# Patient Record
Sex: Female | Born: 1950 | Race: White | Hispanic: No | Marital: Married | Smoking: Never smoker
Health system: Southern US, Academic
[De-identification: ages and names within clinical notes are randomized; demographics above are authoritative.]

## PROBLEM LIST (undated history)

## (undated) DIAGNOSIS — E039 Hypothyroidism, unspecified: Secondary | ICD-10-CM

## (undated) DIAGNOSIS — B029 Zoster without complications: Secondary | ICD-10-CM

## (undated) HISTORY — PX: CYSTOPLASTY: SHX475

## (undated) HISTORY — DX: Hypothyroidism, unspecified: E03.9

## (undated) HISTORY — PX: HX DILATION AND CURETTAGE: SHX78

## (undated) HISTORY — PX: HX KNEE SURGERY: 2100001320

## (undated) HISTORY — PX: HX BREAST LUMPECTOMY: SHX2

## (undated) HISTORY — DX: Zoster without complications: B02.9

## (undated) HISTORY — PX: BLEPHAROPLASTY: SUR158

## (undated) HISTORY — PX: HX FOOT SURGERY: 2100001154

## (undated) HISTORY — PX: RECTAL SURGERY: SHX760

---

## 2015-03-03 IMAGING — MG MAMMOGRAPHY SCREENING BILATERAL 3D TOMOSYNTHESIS WITH CAD
12 of 16 series · 12 of 32 positions shown · non-contrast
Comparison: 01/03/2011
BREAST DENSITY: (Level B) There are scattered areas of fibroglandular density.

MAMMOGRAPHY SCREENING 3D TOMOSYNTHESIS WITH CAD, 03/03/2015 [DATE]:
CLINICAL INDICATION: Screening mammogram.
TECHNIQUE: Digital mammograms and 3-D Tomosynthesis were obtained. These were
interpreted both primarily and with the aid of computer-aided detection
system.

[R CC]
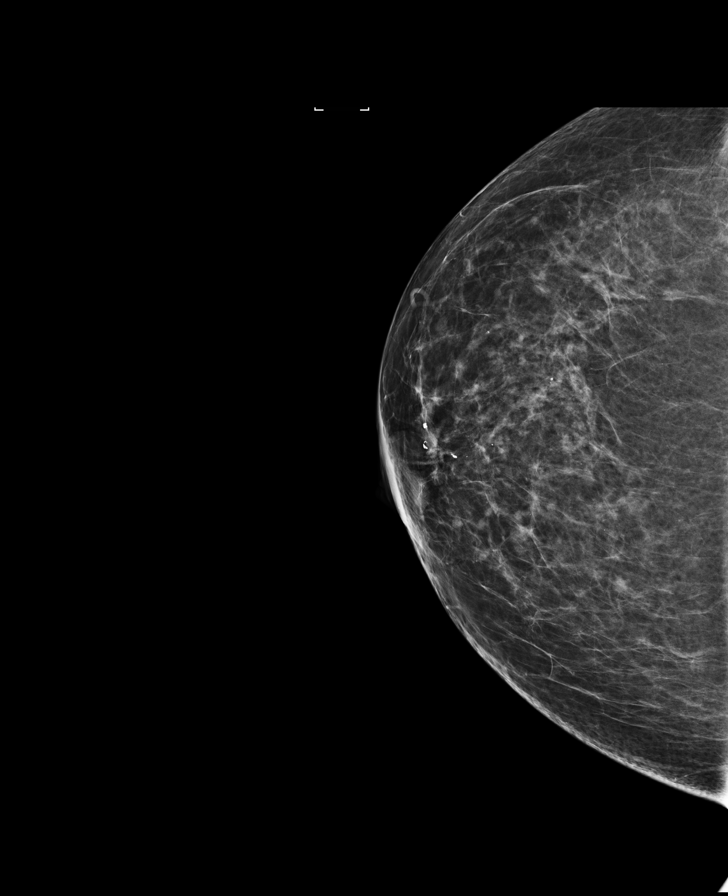

[R MLO synth-2D]
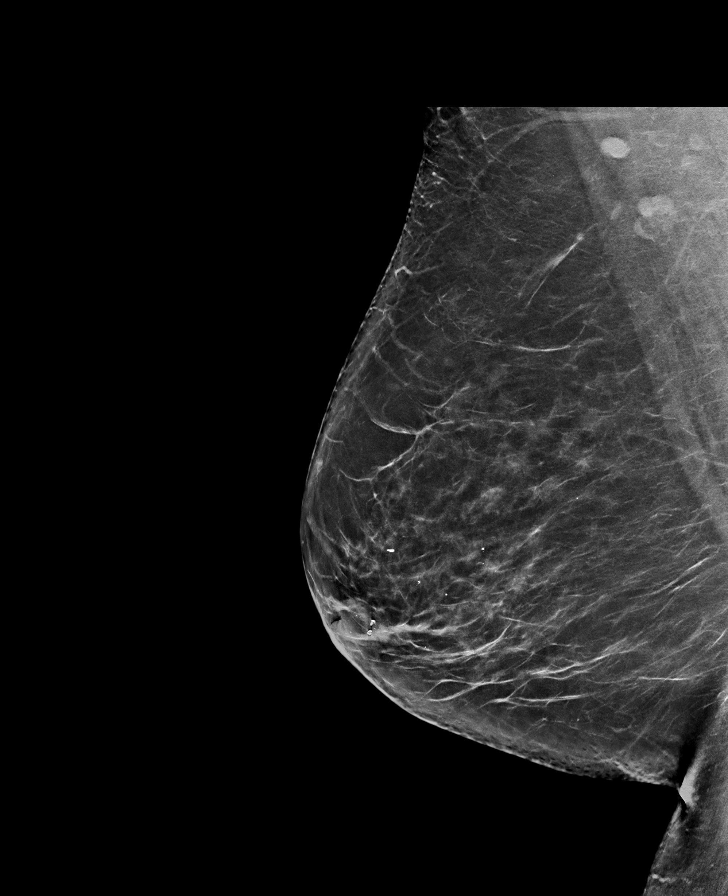

[L CC synth-2D]
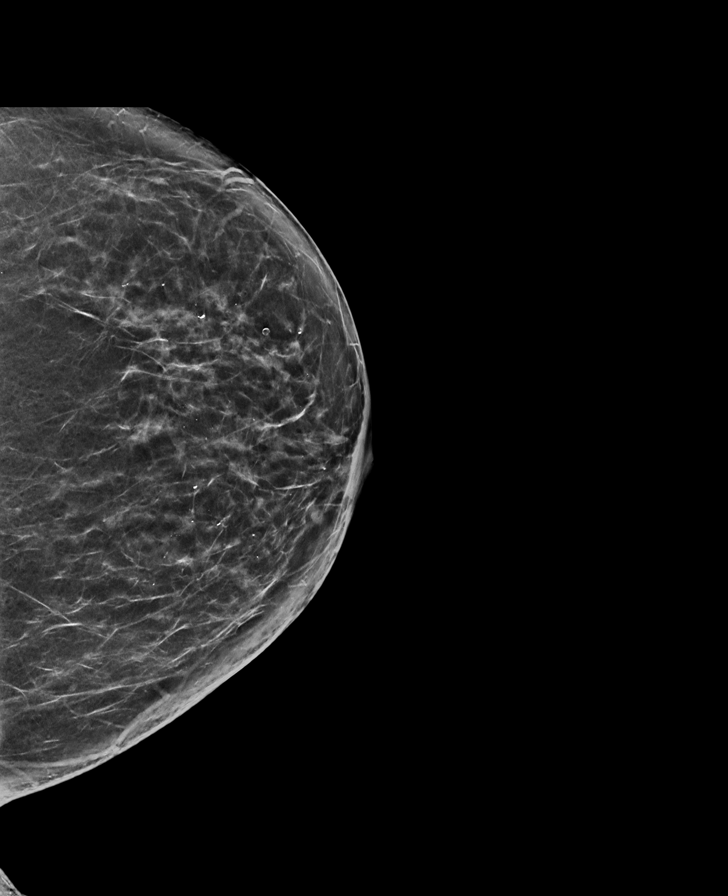

[L MLO]
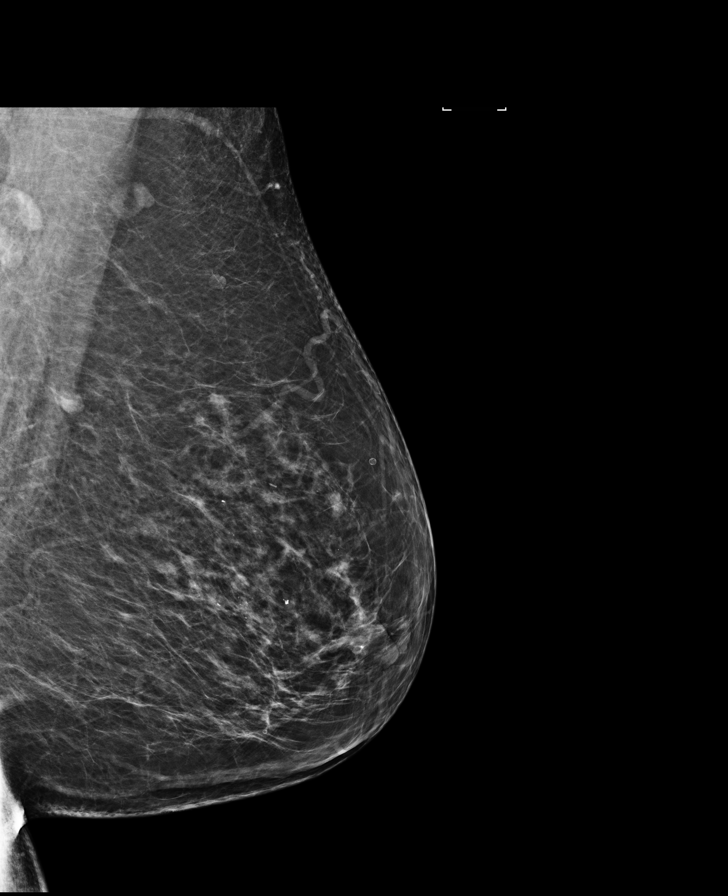

[L MLO synth-2D]
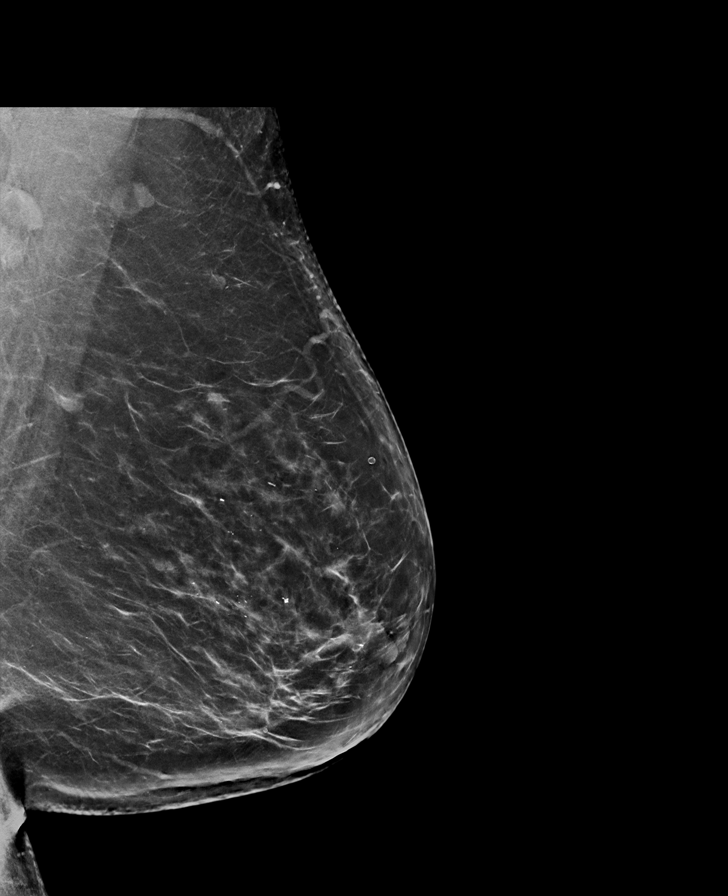

[R CC synth-2D]
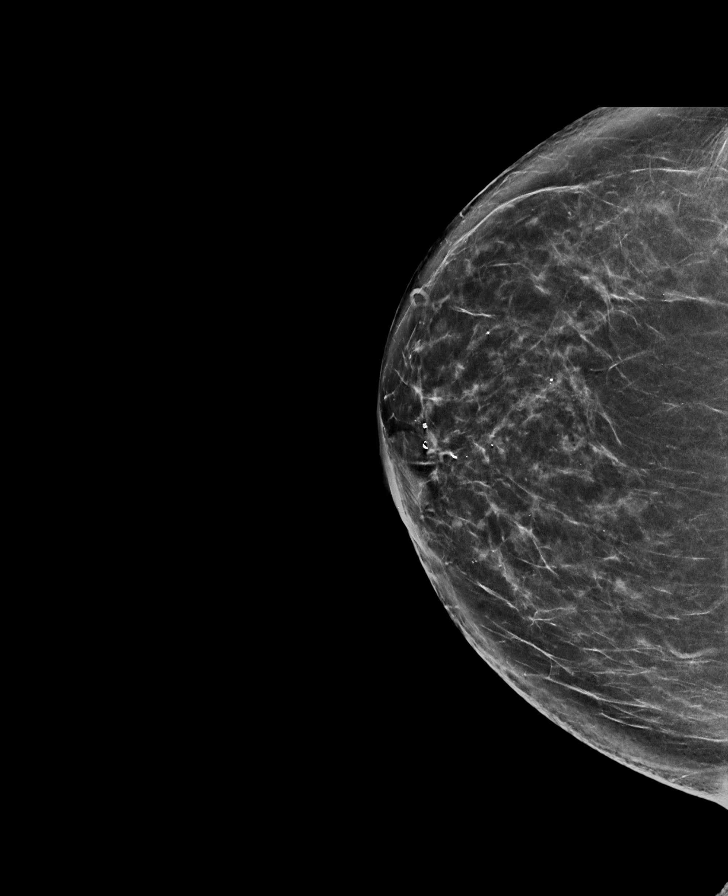

[L CC]
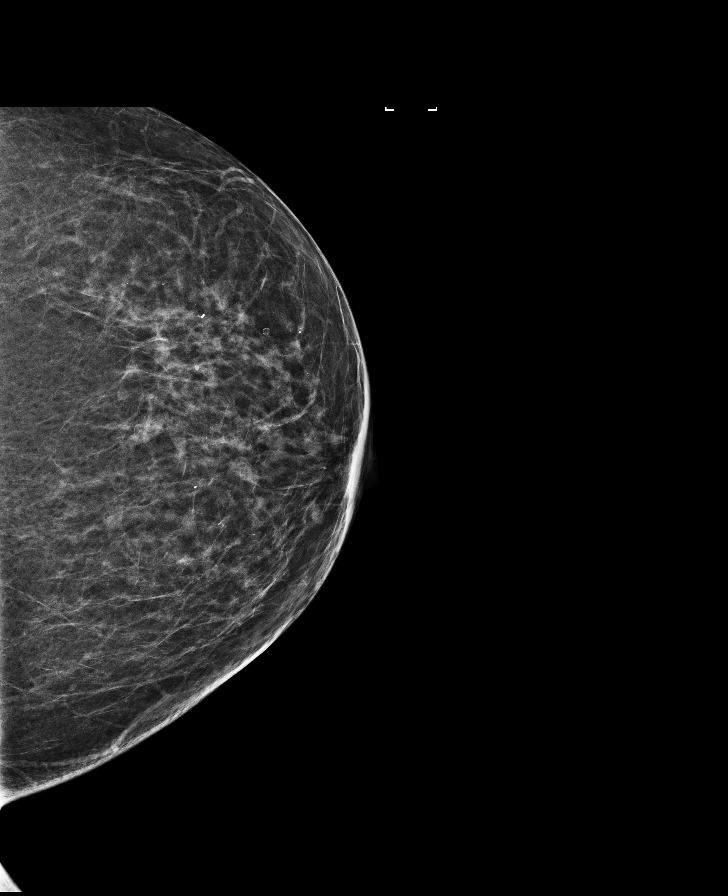

[R MLO]
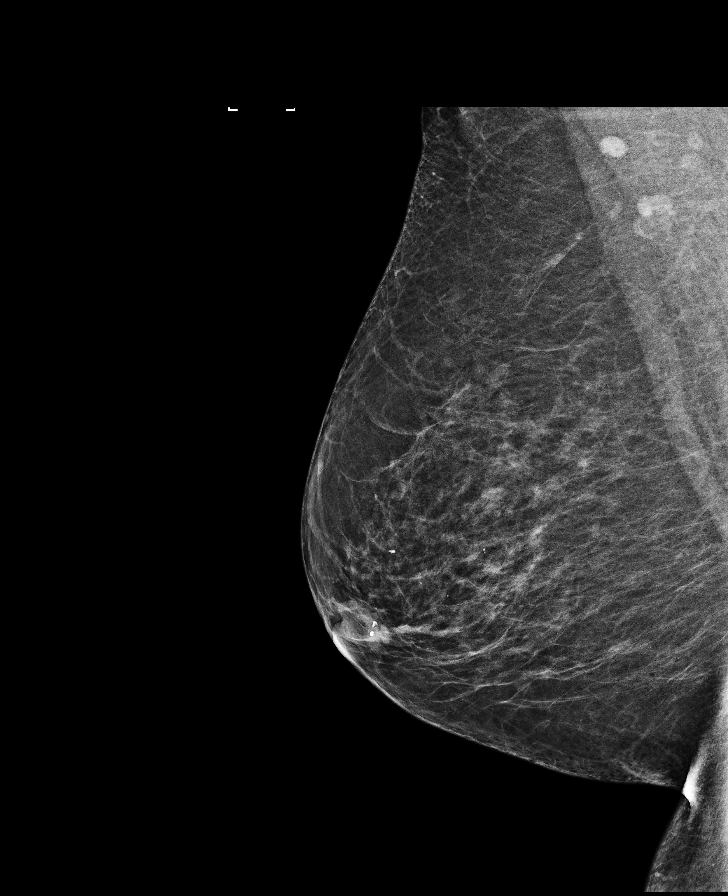

[R MLO tomo]
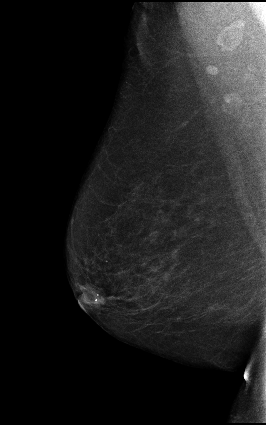

[L MLO tomo]
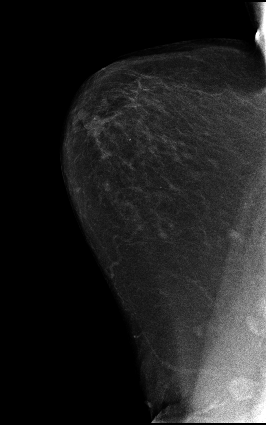

[L CC tomo]
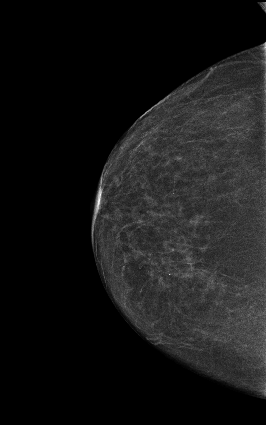

[R CC tomo]
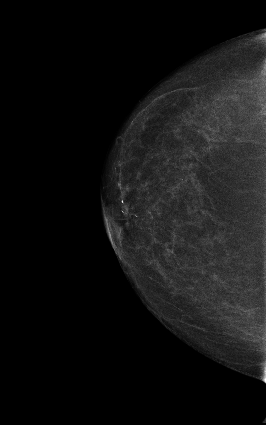

[12 of 32 positions shown; findings below may reference images not displayed]

FINDINGS: Scattered benign coarse calcifications are present in both breasts,
stable from the prior mammogram. There is no dominant mass, tumoral
calcifications or architectural distortion. No change from the prior
mammogram.
IMPRESSION: ( BI-RADS 2) Benign findings. Routine mammographic follow-up is recommended.

## 2017-08-21 ENCOUNTER — Ambulatory Visit (HOSPITAL_COMMUNITY): Payer: Self-pay | Admitting: PHYSICIAN ASSISTANT

## 2018-01-16 IMAGING — MG MAMMOGRAPHY SCREENING BILATERAL 3D TOMOSYNTHESIS WITH CAD
8 series · 8 of 24 positions shown · non-contrast
Comparison: 03/03/2015. 
BREAST DENSITY: (Level B) There are scattered areas of fibroglandular density.

MAMMOGRAPHY SCREENING BILATERAL 3D TOMOSYNTHESIS WITH CAD, 01/16/2018 [DATE]: 
CLINICAL INDICATION: Screening.
TECHNIQUE: Digital bilateral mammograms and 3-D Tomosynthesis were obtained. 
These were interpreted both primarily and with the aid of computer-aided 
detection system.

[R MLO]
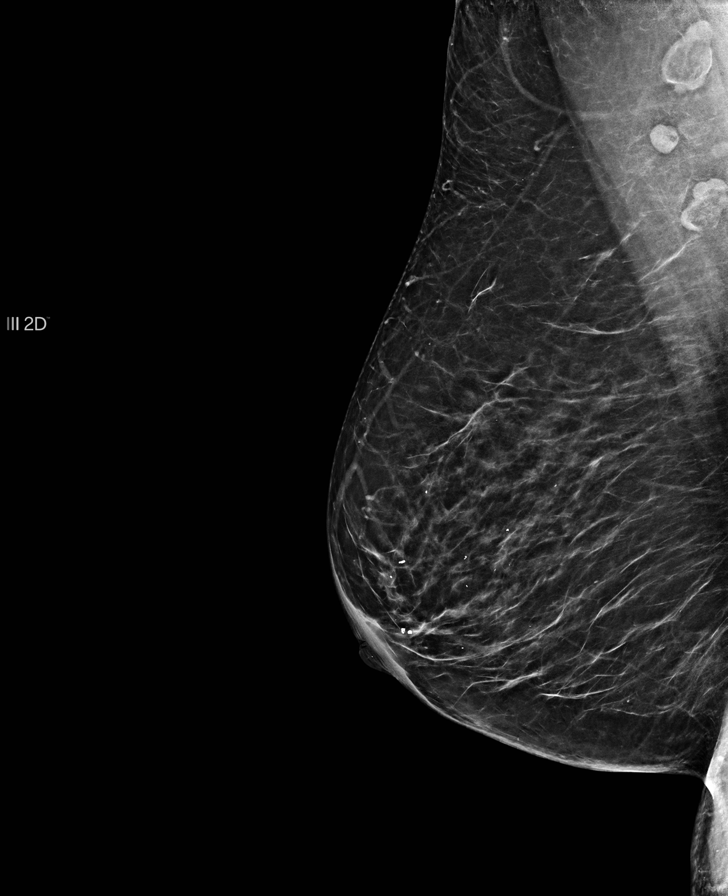

[R CC]
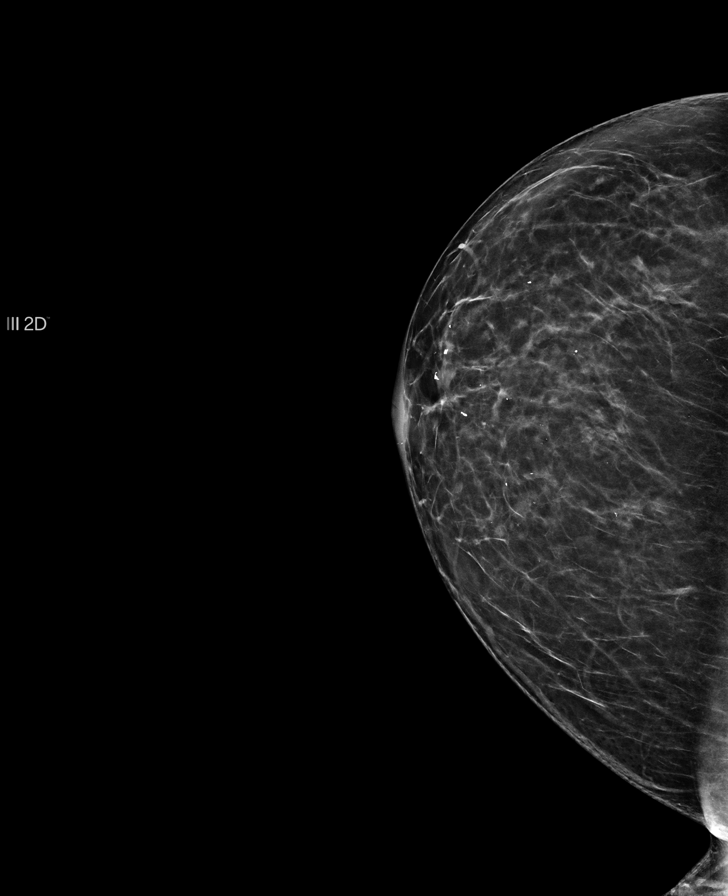

[L CC]
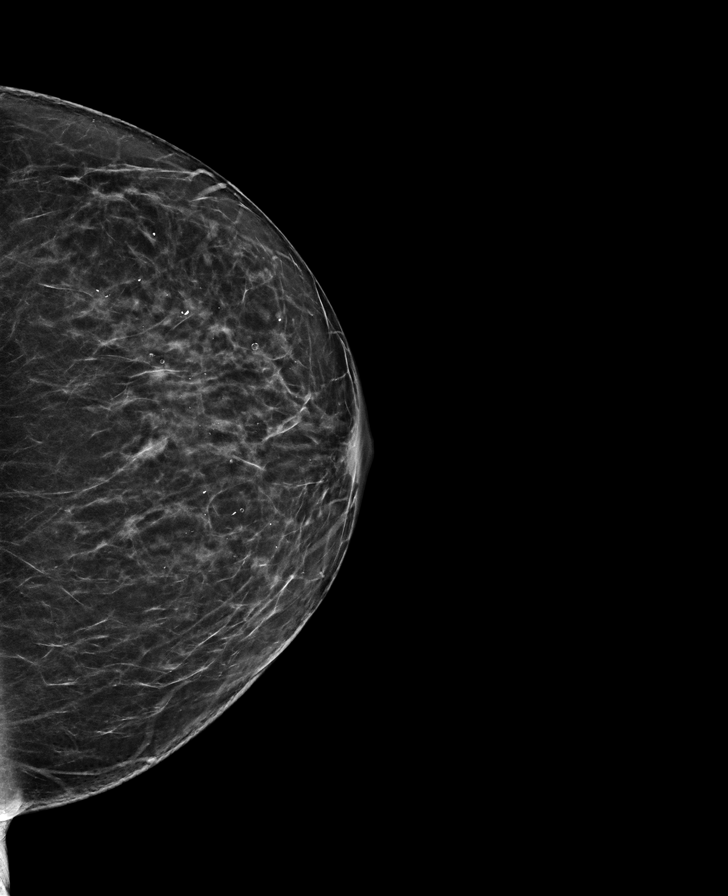

[L MLO]
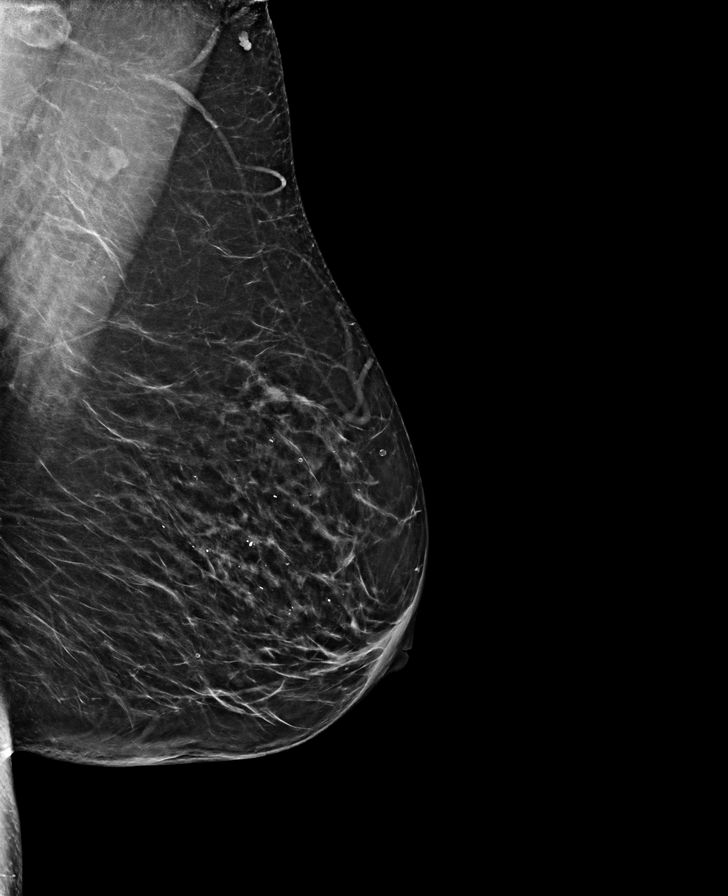

[R CC tomo · tomo slice 31/60.0]
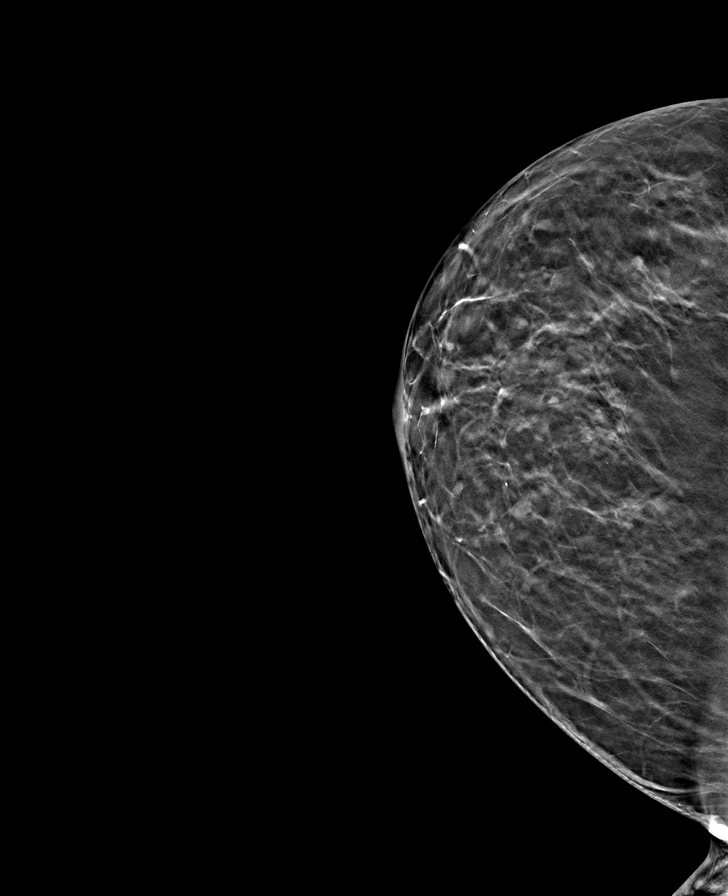

[L MLO tomo · tomo slice 37/73.0]
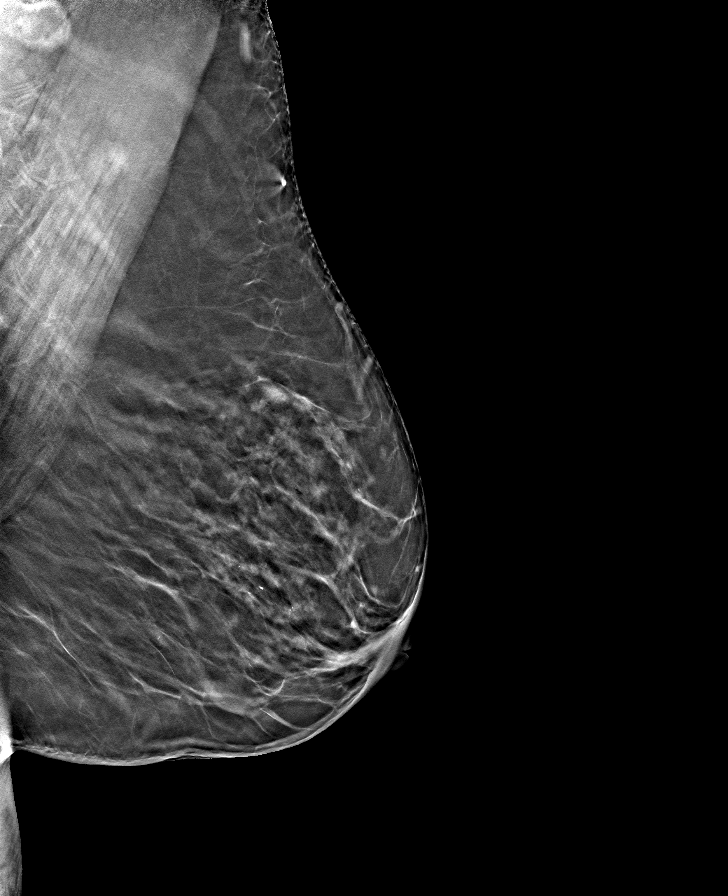

[L CC tomo · tomo slice 31/60.0]
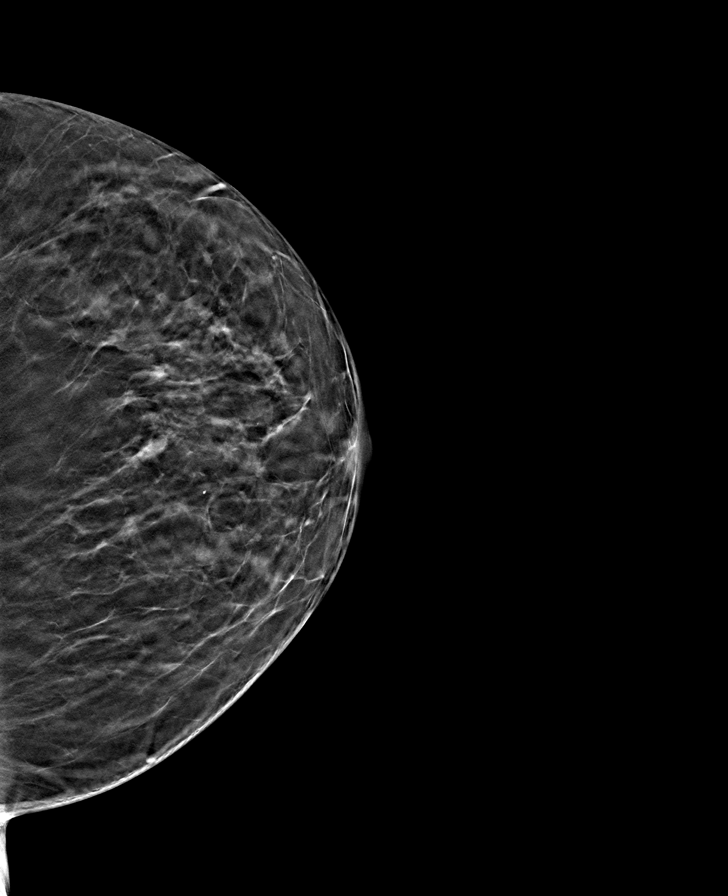

[R MLO tomo · tomo slice 37/74.0]
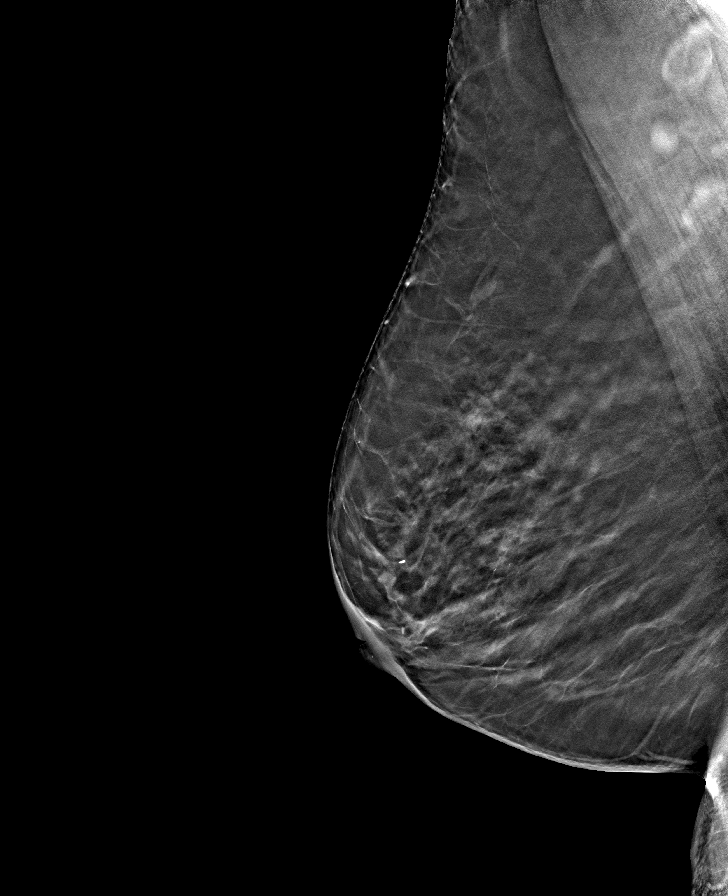

[8 of 24 positions shown; findings below may reference images not displayed]

FINDINGS: No mammographically suspicious abnormality and no significant change.
IMPRESSION: ( BI-RADS 2) Benign findings. Routine mammographic follow-up is recommended.

## 2019-01-05 IMAGING — DX FOOT 3 VIEWS RIGHT
1 series · 3 of 3 positions shown · non-contrast
Comparison: none

FOOT 3 VIEWS RIGHT, 01/05/2019 [DATE]: 
CLINICAL INDICATION:  Pain right great toe for years. Intermittent swelling. 
COMPARISON EXAMINATIONS: None

[Series 1: AP · U · 0.12mm/px · 3 of 3 slices shown]
[im 1/3]
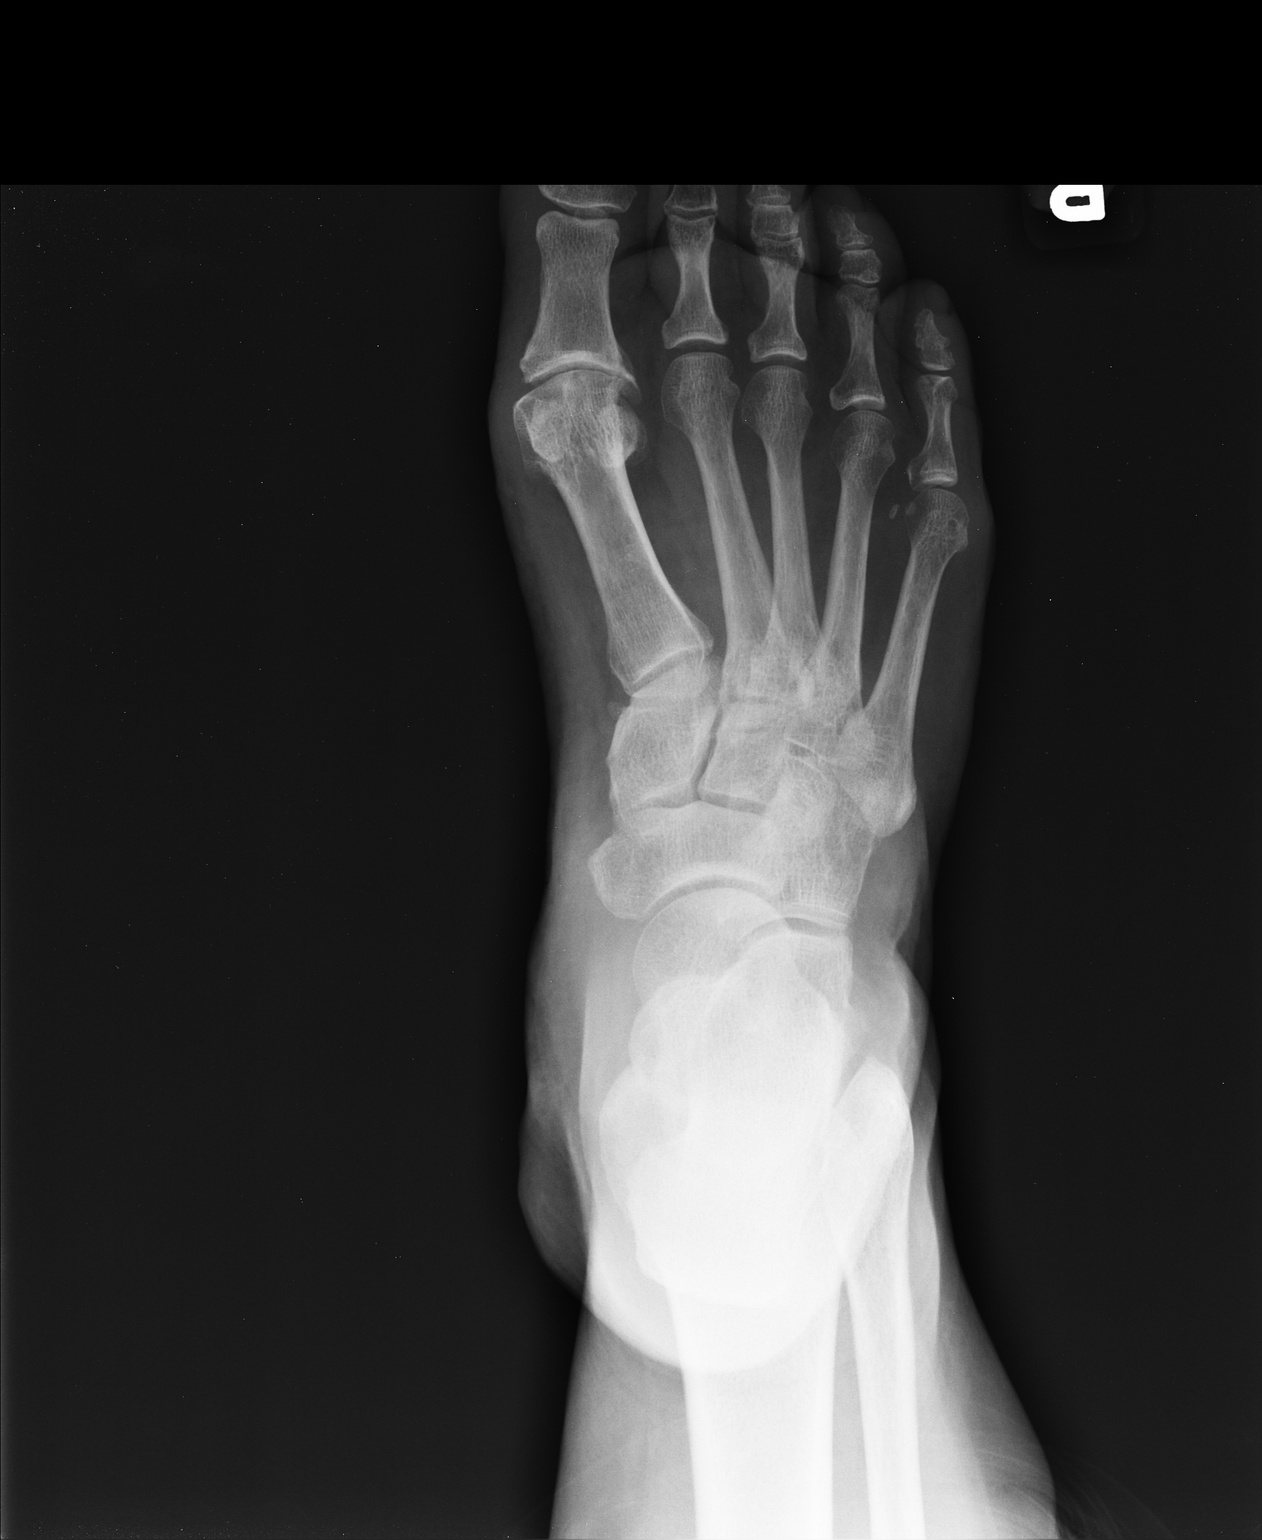
[im 2/3]
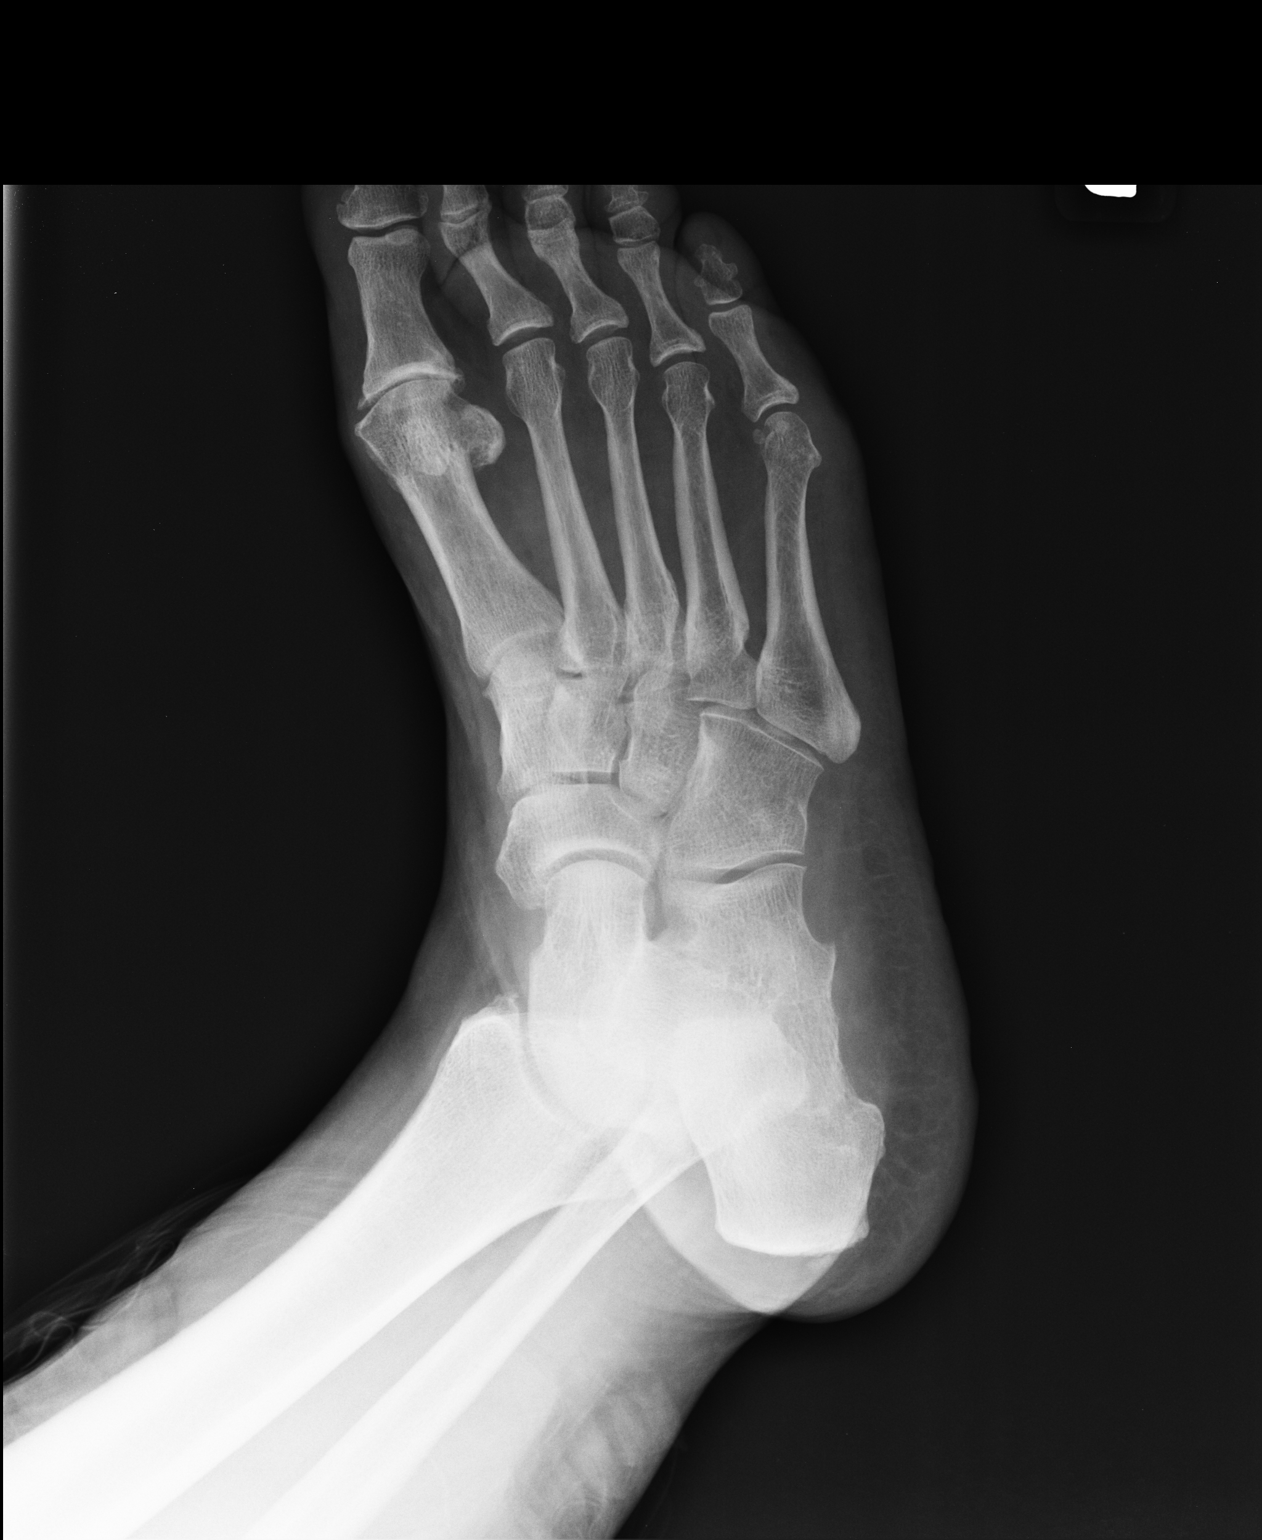
[im 3/3]
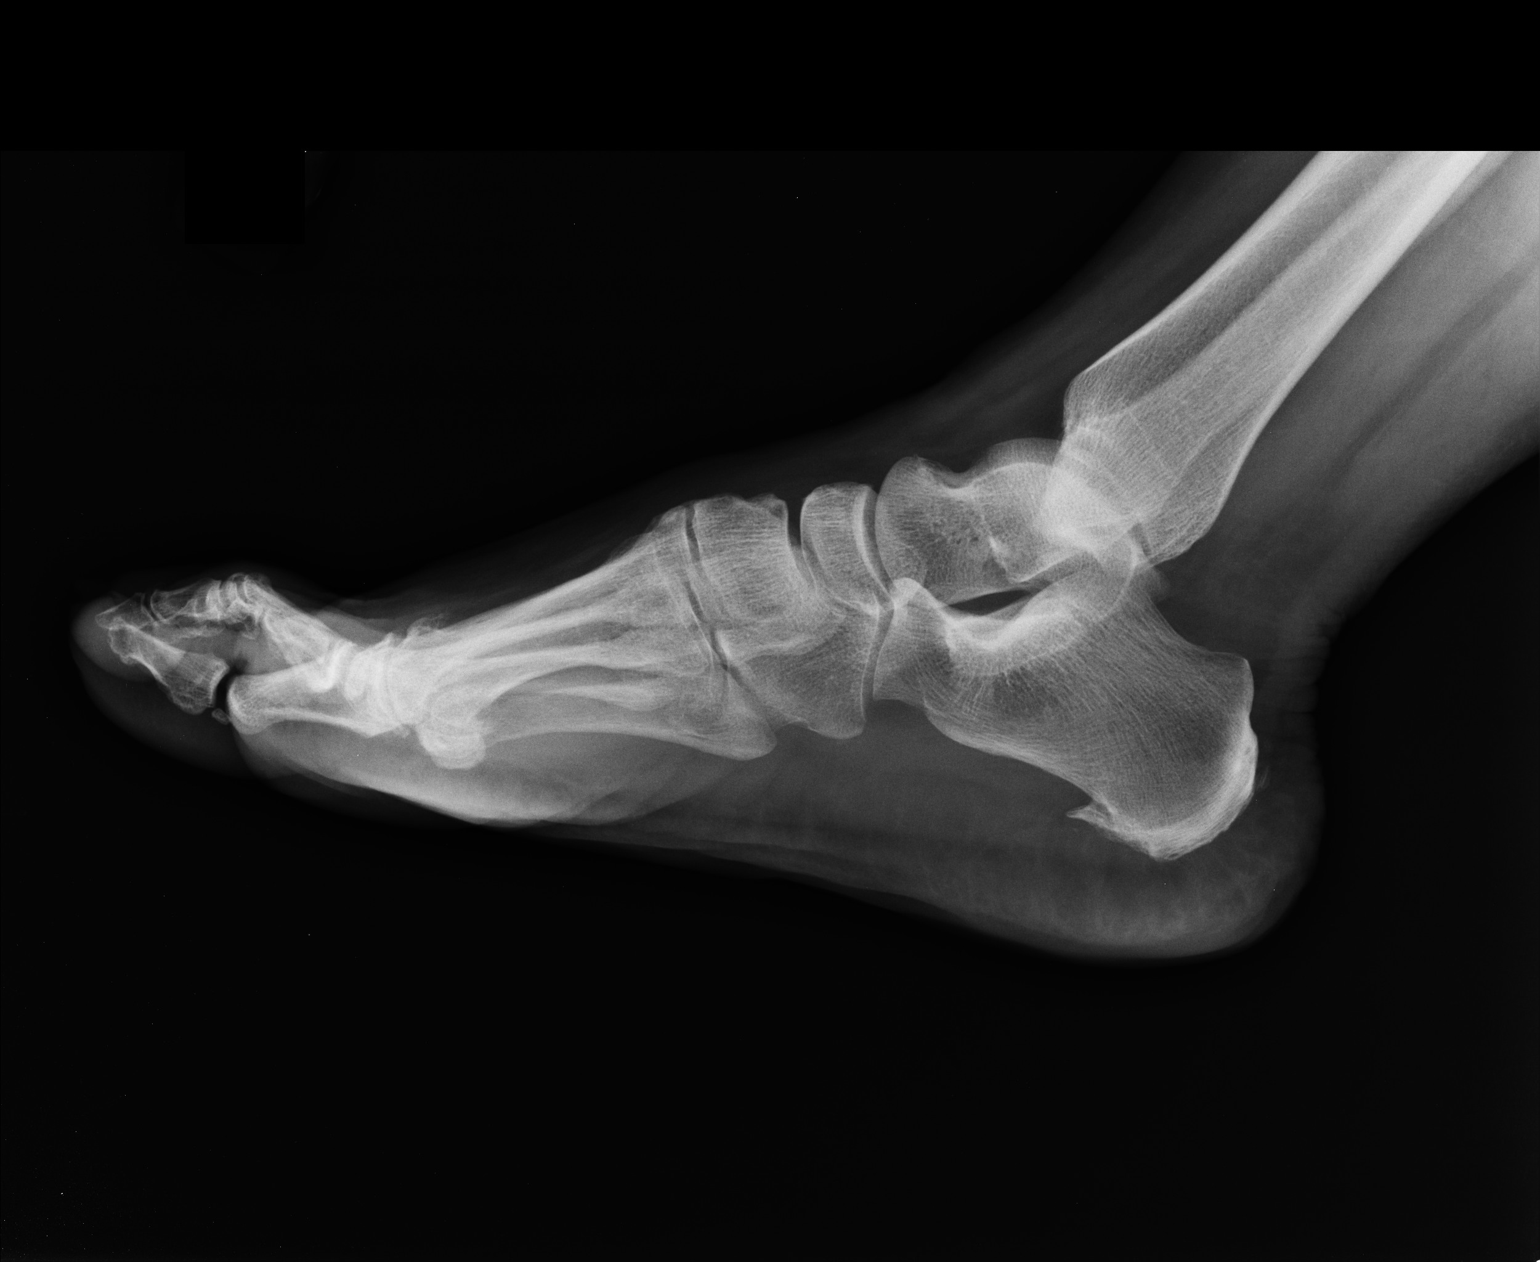

[3 of 3 positions shown; findings below may reference images not displayed]

FINDINGS: There is advanced joint space narrowing of the first MTP articulation 
with osseous remodeling and osteophytic spurring. No fracture. No erosion. No 
periostitis. Slight hallux valgus deformity. Plantar and trace Achilles 
calcaneal spurs.
IMPRESSION: Focally advanced degenerative changes with osteoarthritic appearance of the 
first MTP articulation.

## 2019-03-01 IMAGING — MG MAMMOGRAPHY SCREENING BILATERAL 3D TOMOSYNTHESIS WITH CAD
8 series · 8 of 24 positions shown · non-contrast
Comparison: Comparison was made to prior exams. 
BREAST DENSITY: (Level B) There are scattered areas of fibroglandular density.

MAMMOGRAPHY SCREENING BILATERAL 3D TOMOSYNTHESIS WITH CAD, 03/01/2019 [DATE]: 
CLINICAL INDICATION: Screening exam.
TECHNIQUE: Digital bilateral mammograms and 3-D Tomosynthesis were obtained. 
These were interpreted both primarily and with the aid of computer-aided 
detection system.

[L MLO]
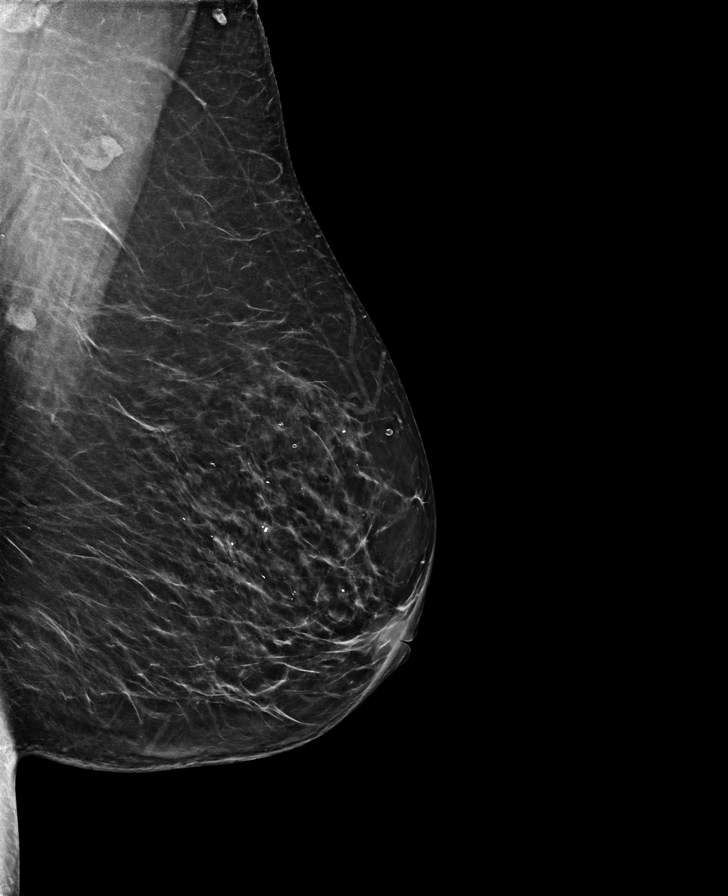

[R MLO]
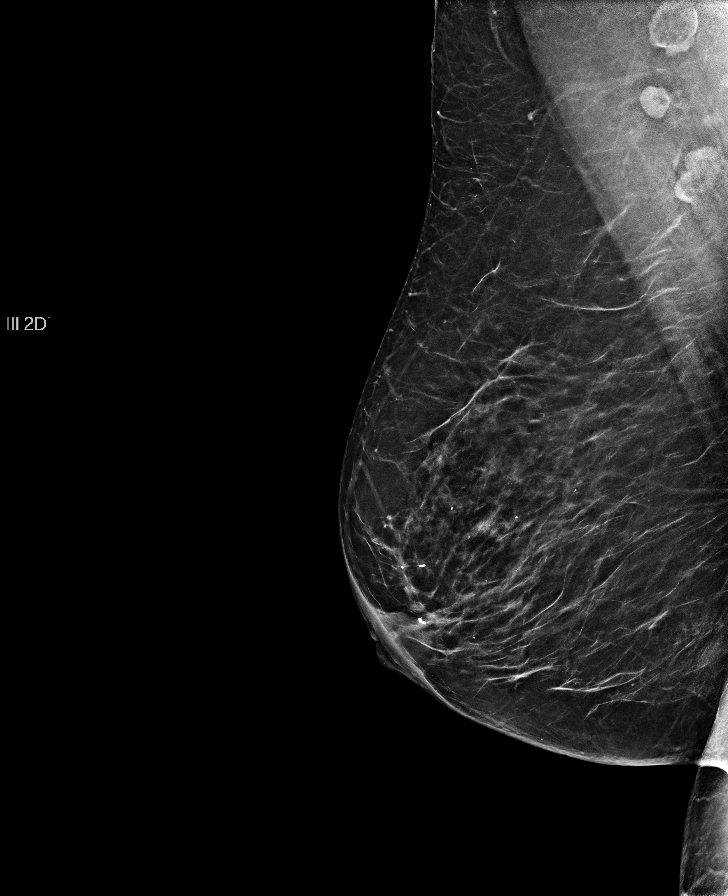

[R CC]
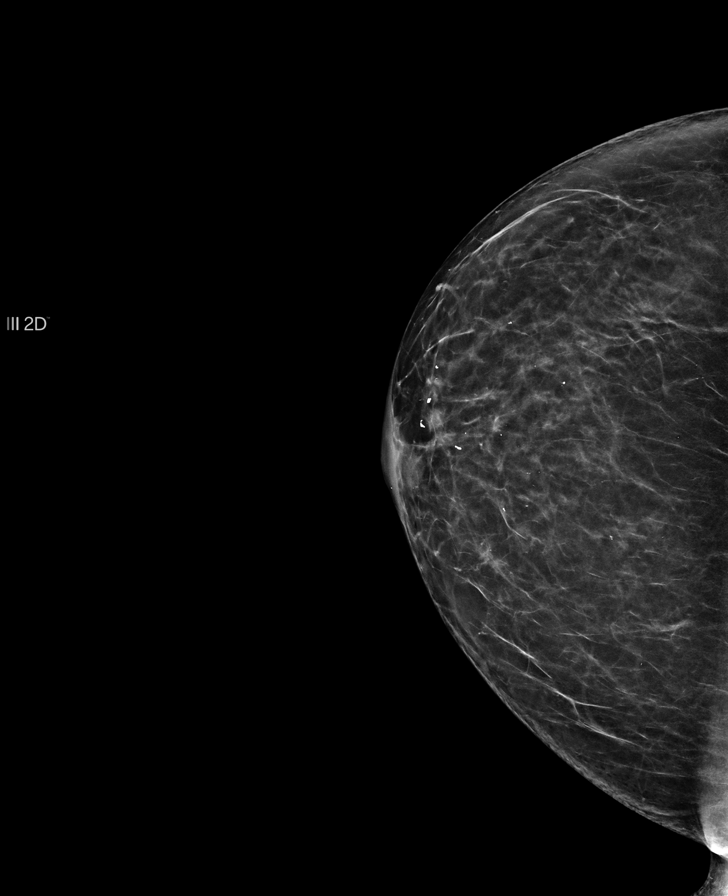

[L CC]
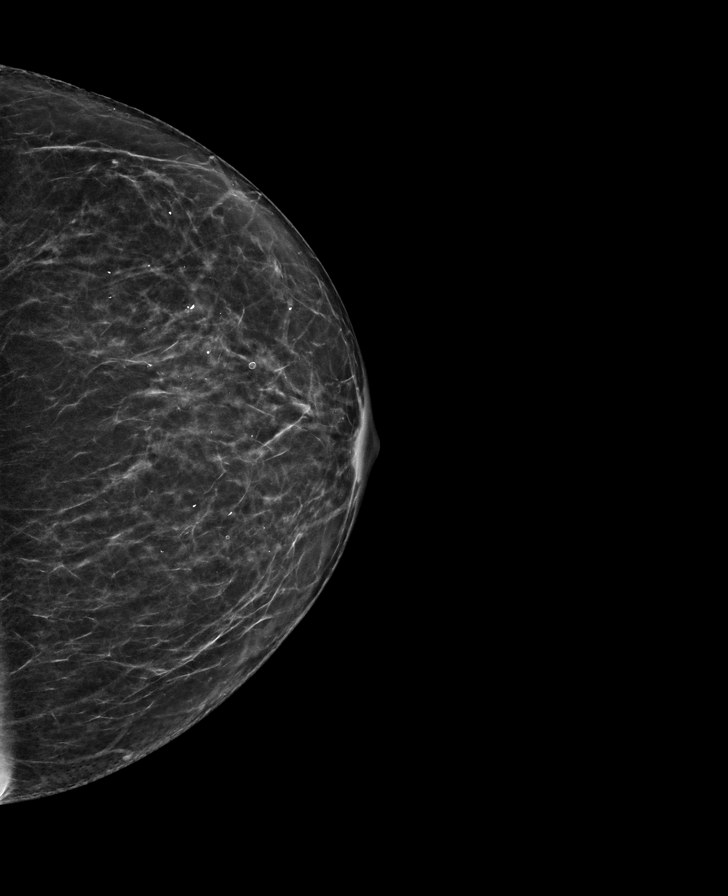

[R CC tomo · tomo slice 31/61.0]
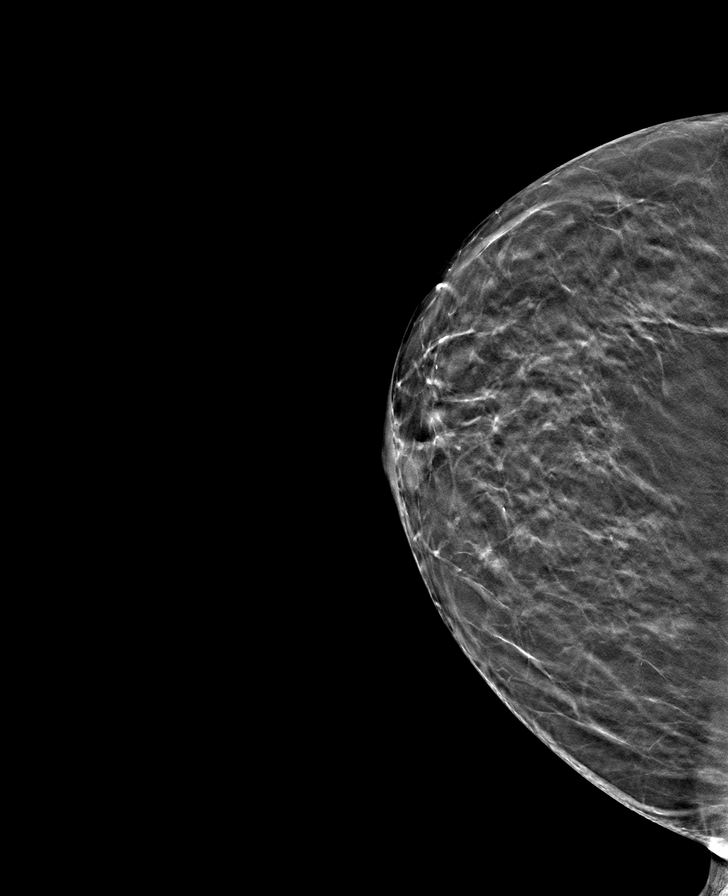

[R MLO tomo · tomo slice 37/74.0]
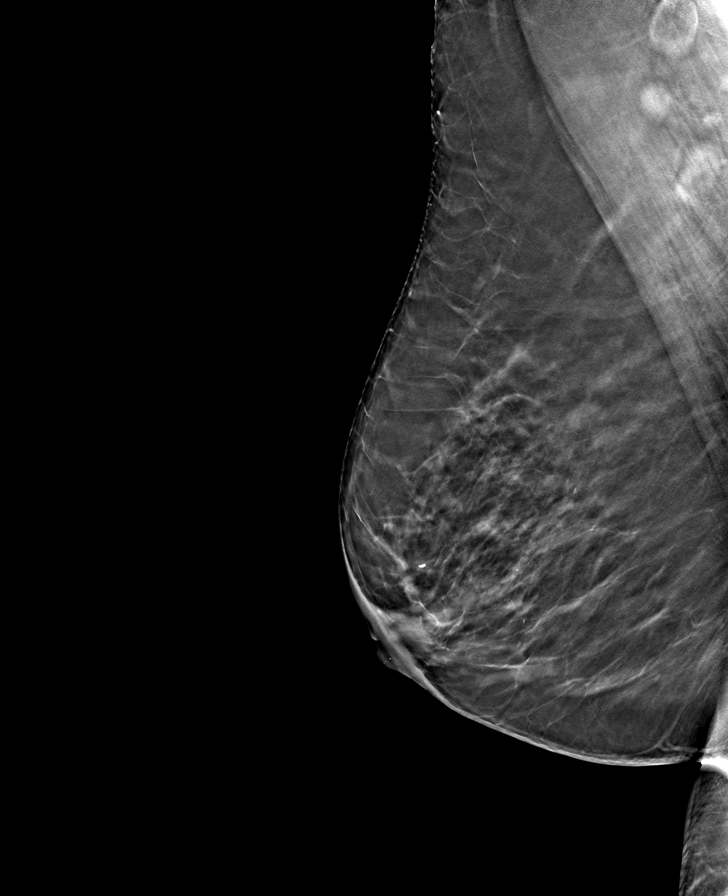

[L MLO tomo · tomo slice 37/72.0]
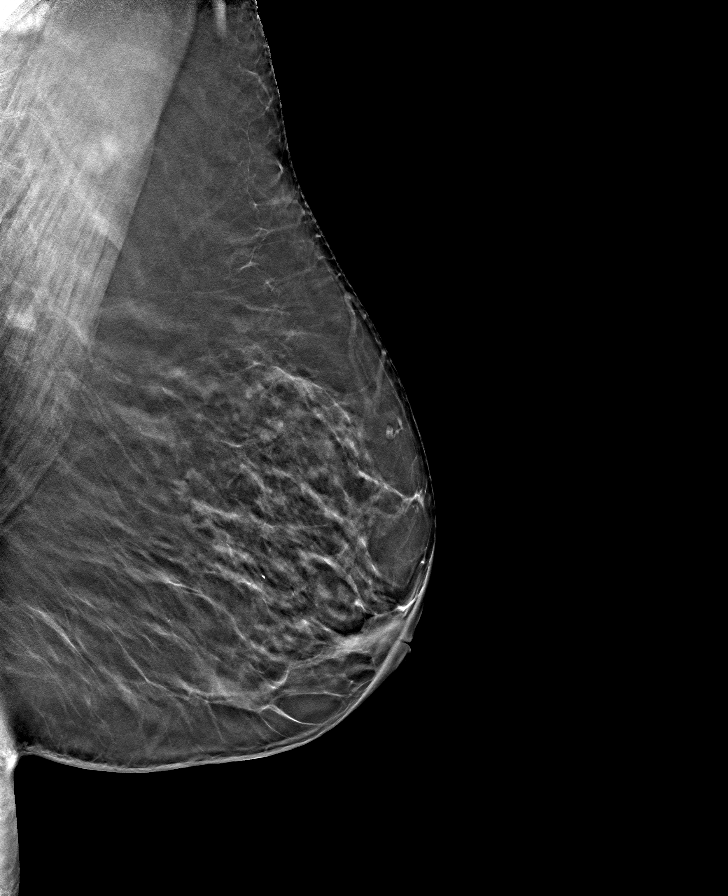

[L CC tomo · tomo slice 29/57.0]
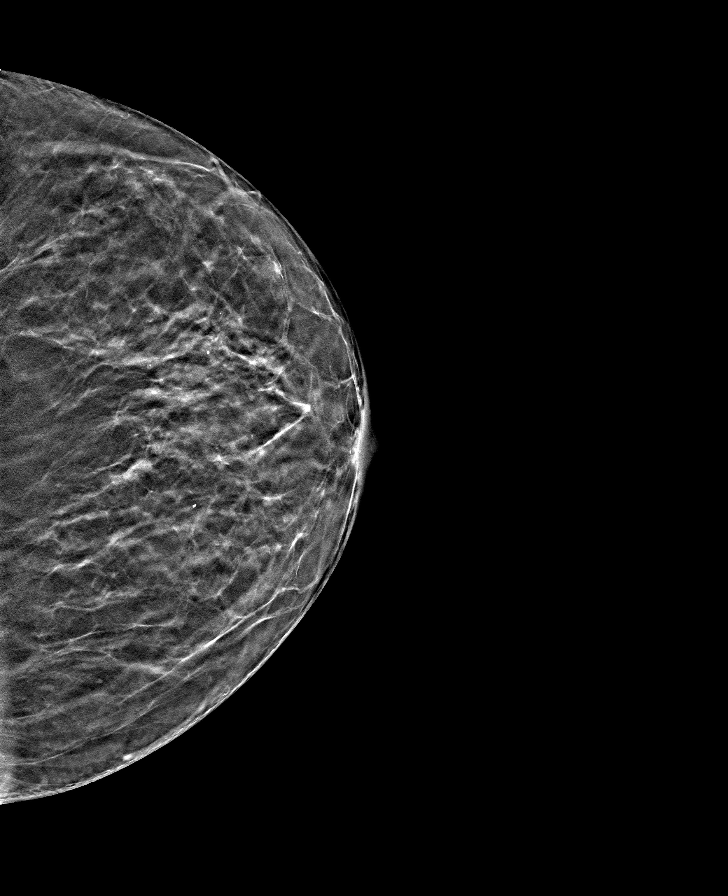

[8 of 24 positions shown; findings below may reference images not displayed]

FINDINGS: No suspicious mass, calcifications, or area of architectural 
distortion in either breast.
IMPRESSION: No mammographic evidence of malignancy in either breast. 
( BI-RADS 1) Negative mammogram. Routine mammographic follow-up is recommended.

## 2020-04-20 ENCOUNTER — Ambulatory Visit: Payer: Medicare Other | Attending: PHYSICIAN ASSISTANT | Admitting: PHYSICIAN ASSISTANT

## 2020-04-20 ENCOUNTER — Encounter (INDEPENDENT_AMBULATORY_CARE_PROVIDER_SITE_OTHER): Payer: Self-pay | Admitting: PHYSICIAN ASSISTANT

## 2020-04-20 ENCOUNTER — Other Ambulatory Visit: Payer: Self-pay

## 2020-04-20 VITALS — BP 130/80 | HR 72 | Temp 98.0°F | Ht 66.0 in | Wt 223.0 lb

## 2020-04-20 DIAGNOSIS — E039 Hypothyroidism, unspecified: Secondary | ICD-10-CM

## 2020-04-20 HISTORY — DX: Hypothyroidism, unspecified: E03.9

## 2020-04-20 MED ORDER — LEVOTHYROXINE 112 MCG TABLET
112.0000 ug | ORAL_TABLET | Freq: Every morning | ORAL | 4 refills | Status: DC
Start: 2020-04-20 — End: 2020-10-20

## 2020-04-20 NOTE — Progress Notes (Signed)
[facility]  [address]  [place] [address]  Operated by [facility]  Progress Note    Name: Breia Ocampo MRN:  [id number]   Date: 04/20/2020 Age: 70 y.o.       Subjective:     Patient ID:   Amala Petion is an 70 y.o. female.      Chief Complaint:      Chief Complaint   Patient presents with   . Hypothyroidism     F/u patient complains, of hair, loss, fatigue, sob        TSH increased from 2.180 to 5.380  Free T4 decreased from 1.35 to 1.09    She had COVID in October 2021  Had Regeneron    Synthroid at 100 mcg daily now    History of aortic stenosis that has worsened and dilated ventricles.  Having MRI of heart tomorrow              Review of Systems   Constitutional: Negative for weight loss.   Cardiovascular: Negative for chest pain and palpitations.       Objective:   Physical Exam  Neck:      Thyroid: Thyroid normal.      Vascular: No carotid bruit.   Cardiovascular:      Rate and Rhythm: Regular rhythm.   Musculoskeletal:      Cervical back: Neck supple.   Lymphadenopathy:   No no anterior cervical adenopathy or no posterior cervical adenopathy.    Upper Body: No supraclavicular adenopathy is present.     Pulm:    Lungs are clear to auscultation.    Thyroid:  Thyroid normal.                Assessment & Plan:       ICD-10-CM    1. Hypothyroidism, adult  E03.9      Plan:  1.  Increase Synthroid 112 mcg daily   2.  TSH, Free T4 early July and discuss results by phone  3.  TSH, Free T4 early October      Amy Butts, [place]

## 2020-04-20 NOTE — Patient Instructions (Signed)
1.  Increase Synthroid 112 mcg daily   2.  TSH, Free T4 early July and discuss results by phone  3.  TSH, Free T4 early October

## 2020-05-15 IMAGING — MG MAMMOGRAPHY SCREENING BILATERAL 3D TOMOSYNTHESIS WITH CAD
8 series · 8 of 24 positions shown · non-contrast
Comparison: Comparison was made to prior examinations.

MAMMOGRAPHY SCREENING BILATERAL 3D TOMOSYNTHESIS WITH CAD, 05/15/2020 [DATE]: 
CLINICAL INDICATION: Screening exam.
TECHNIQUE: Digital bilateral mammograms and 3-D Tomosynthesis were obtained. 
These were interpreted both primarily and with the aid of computer-aided 
detection system. 
BREAST DENSITY: (Level B) There are scattered areas of fibroglandular density.

[R CC]
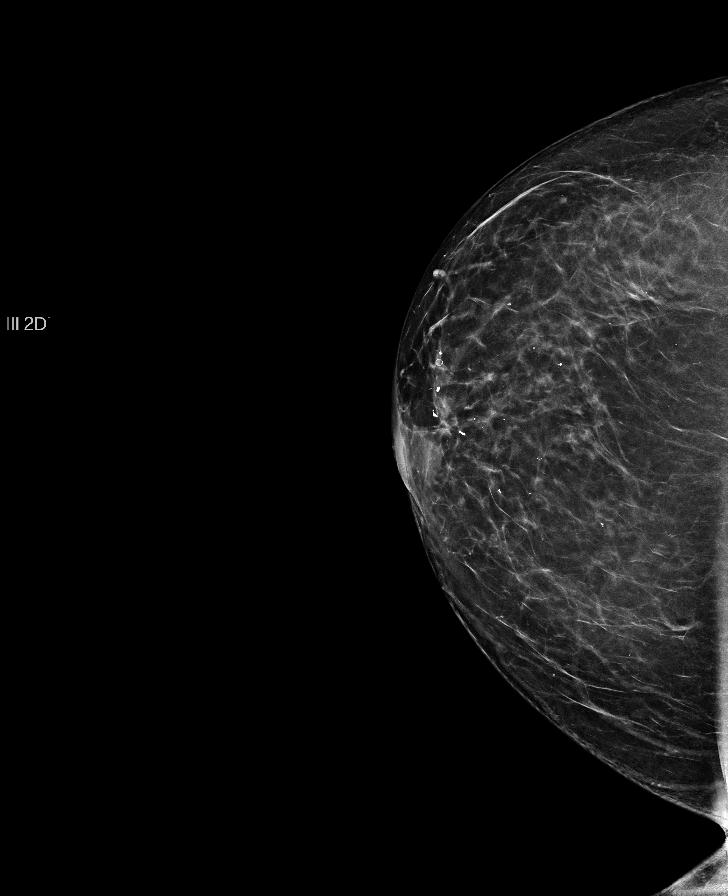

[R MLO]
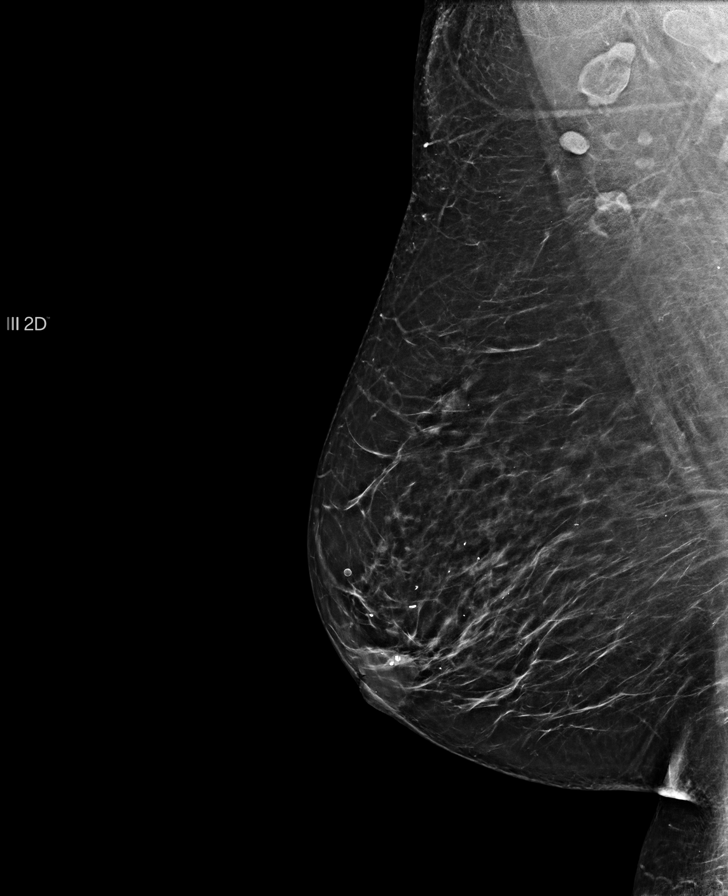

[L MLO]
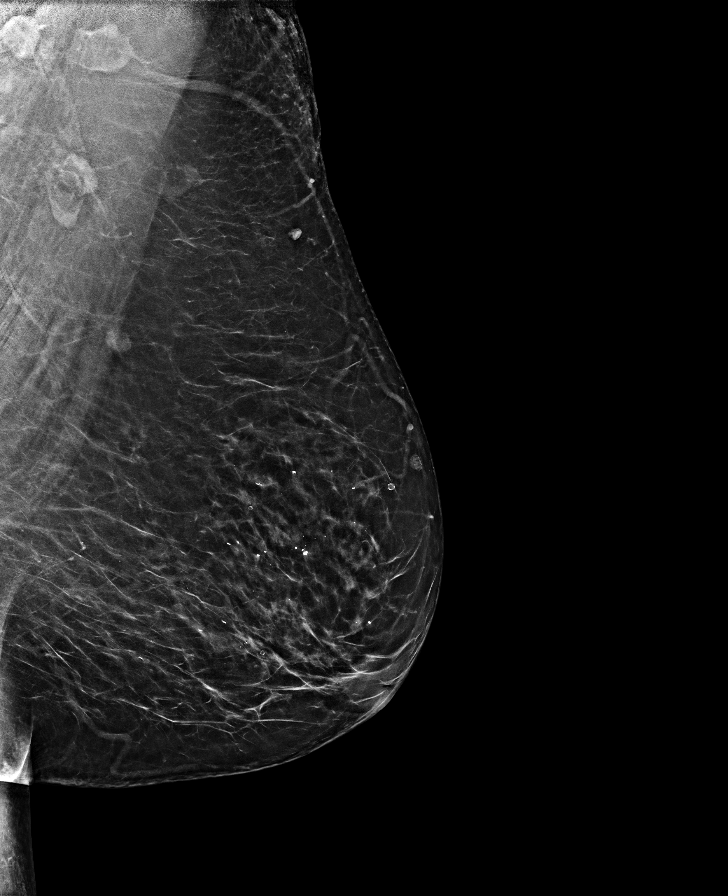

[L CC]
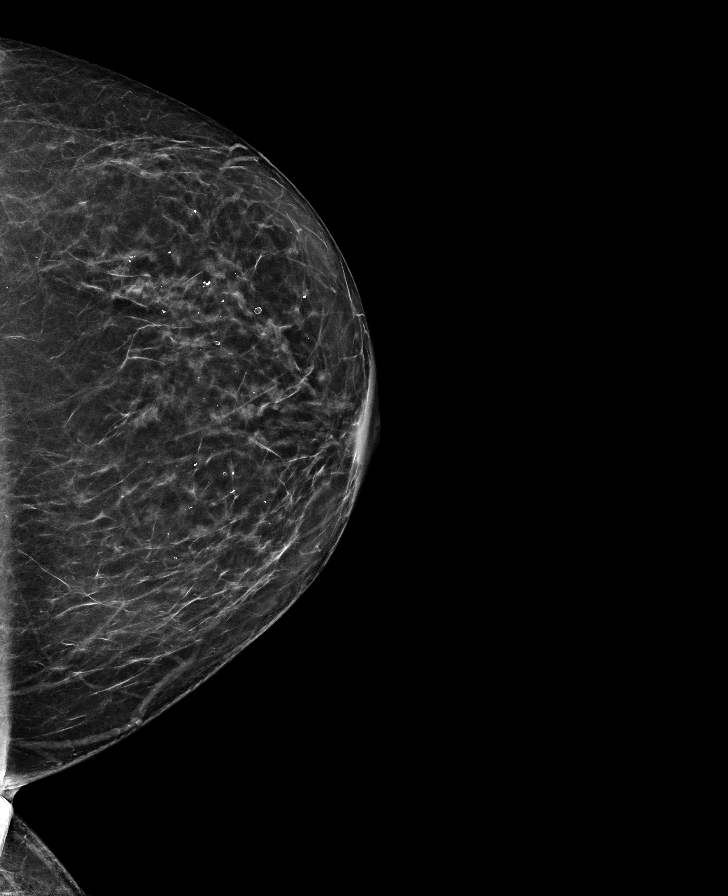

[R MLO tomo · tomo slice 43/84.0]
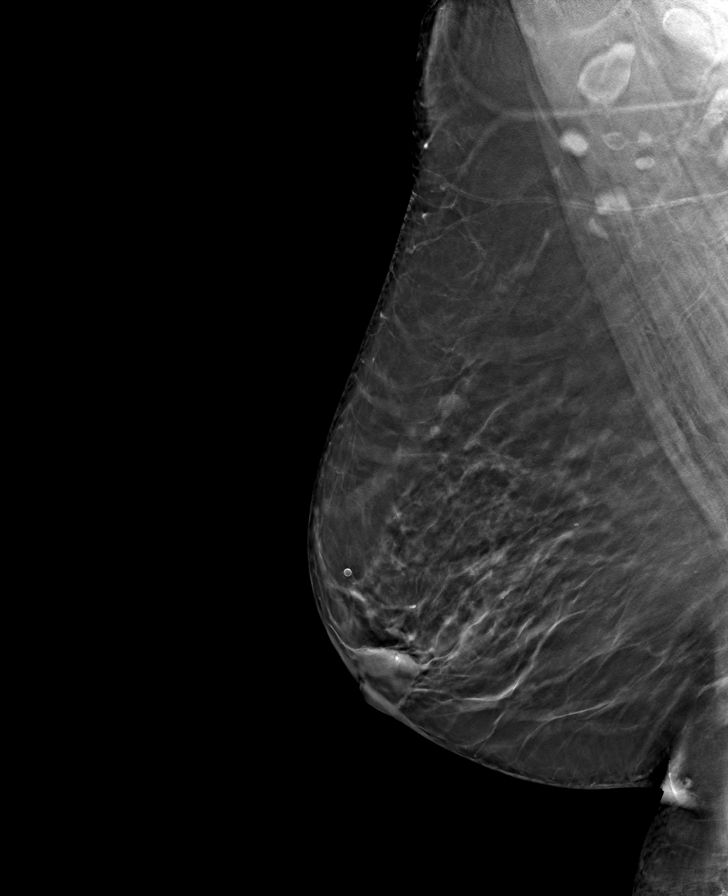

[L MLO tomo · tomo slice 41/80.0]
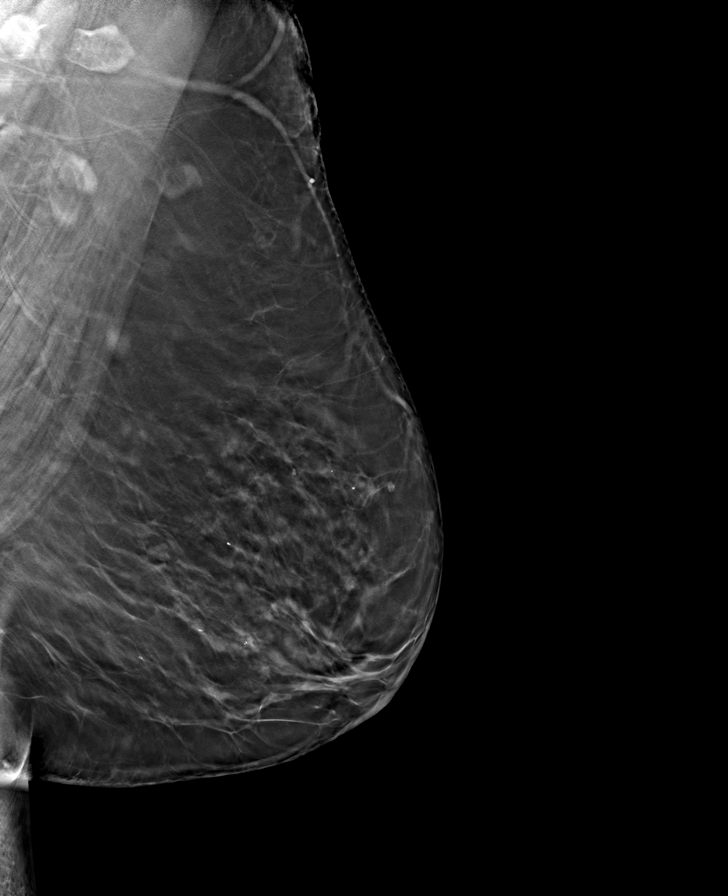

[L CC tomo · tomo slice 33/65.0]
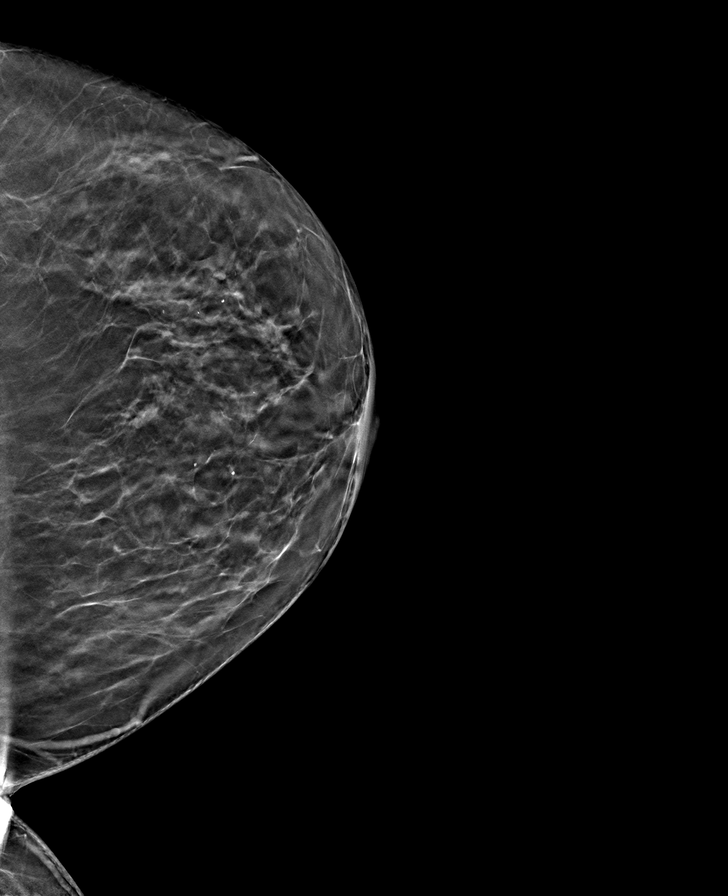

[R CC tomo · tomo slice 35/69.0]
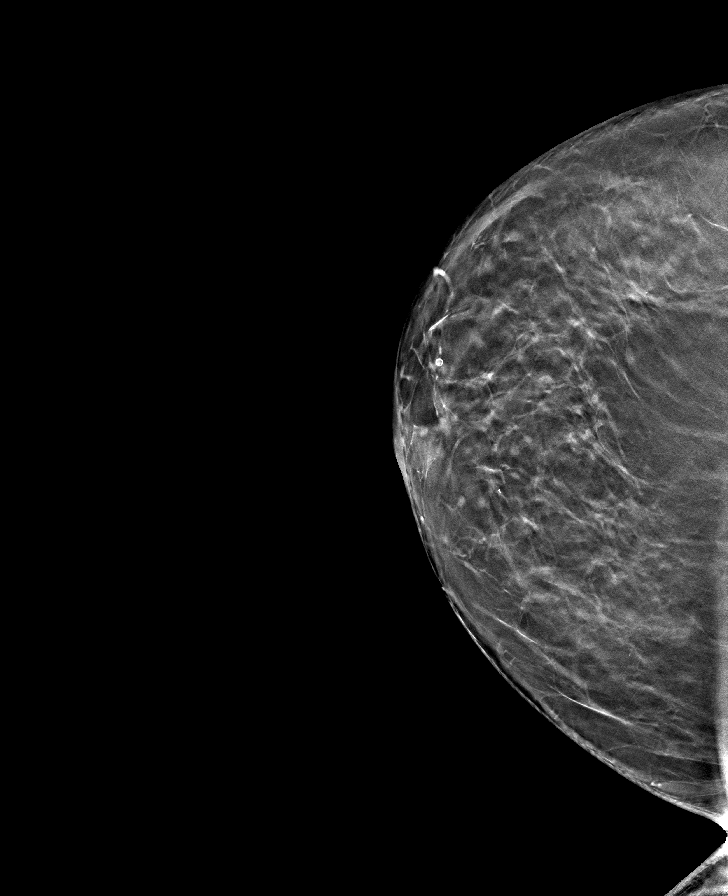

[8 of 24 positions shown; findings below may reference images not displayed]

FINDINGS: Stable exam. No suspicious mass, calcifications, or area of 
architectural distortion in either breast.
IMPRESSION: No mammographic evidence of malignancy in either breast. 
( BI-RADS 2) Benign findings. Routine mammographic follow-up is recommended.

## 2020-07-24 ENCOUNTER — Telehealth (INDEPENDENT_AMBULATORY_CARE_PROVIDER_SITE_OTHER): Payer: Self-pay | Admitting: PHYSICIAN ASSISTANT

## 2020-07-24 NOTE — Telephone Encounter (Addendum)
Patient had labs done and wants results. Labs scanned in chart      Patient informed

## 2020-07-24 NOTE — Patient Instructions (Signed)
TSH is 0.4  Free T4 is normal    No change in dose

## 2020-08-24 IMAGING — MR MRI RIGHT KNEE WITHOUT CONTRAST
4 of 5 series · 18 of 40 positions shown · IV contrast (gadolinium)
Comparison: None

FINAL Diagnostic Imaging Report 
Referring: CHE WAH BILLIE                  
________________________________________________________________________________________________ 
MRI RIGHT KNEE WITHOUT CONTRAST, 08/24/2020 [DATE]: 
CLINICAL INDICATION: Patient was walking 2 days ago and right knee gave out on 
her. Patient went down right knee. Medial right knee pain and swelling. No 
history of surgery.
TECHNIQUE: Multiplanar, multiecho position MR images of the knee were performed 
without intravenous gadolinium enhancement. Patient was scanned on a
magnet.

[Series 4: PD fat-sat · axial · 3.5mm · 0.31mm/px · z∈[-79,+37]mm · 9 of 34 slices shown (1 of 3)]
[im 1/34]
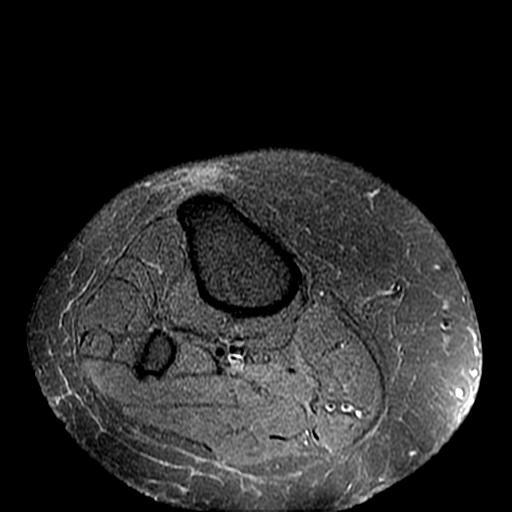
[im 4/34]
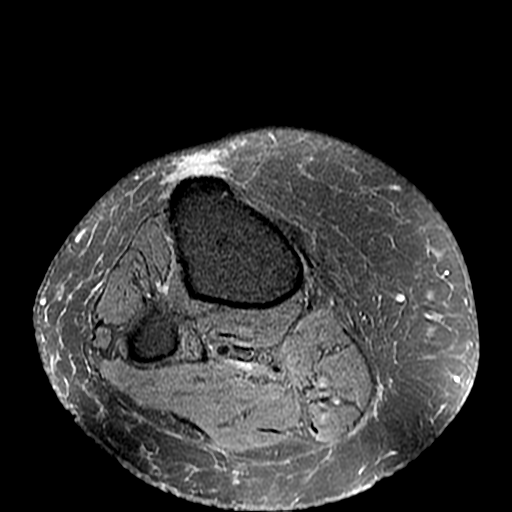
[im 7/34]
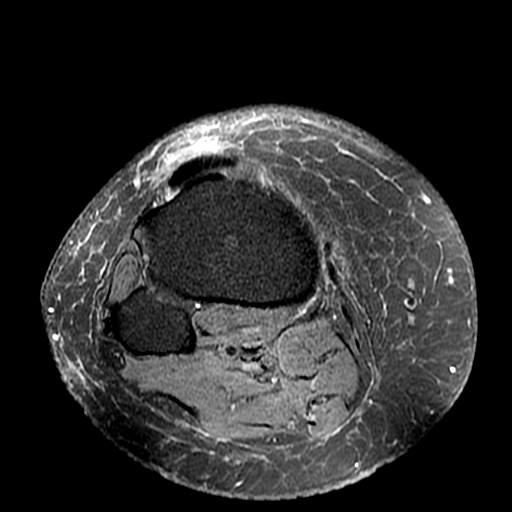
[im 10/34]
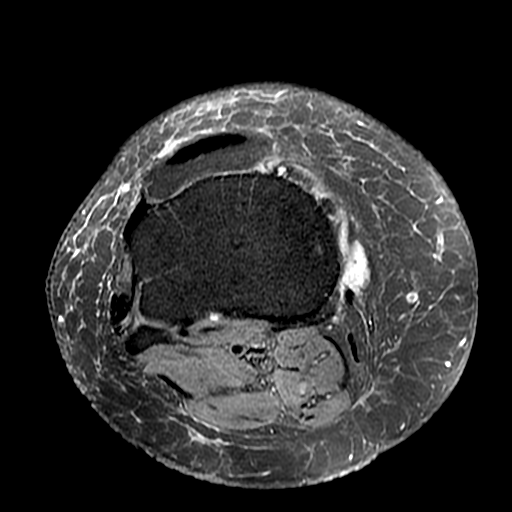
[im 14/34]
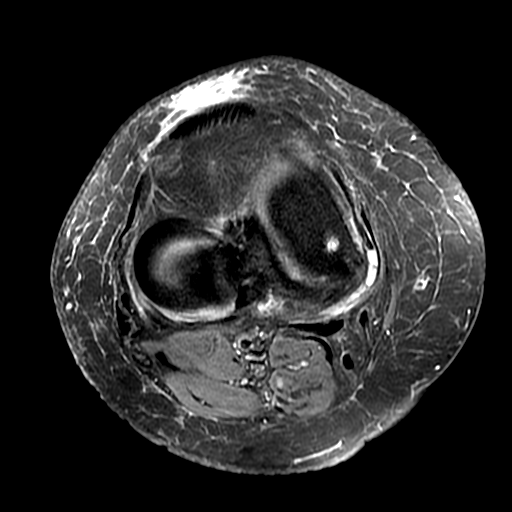
[im 17/34]
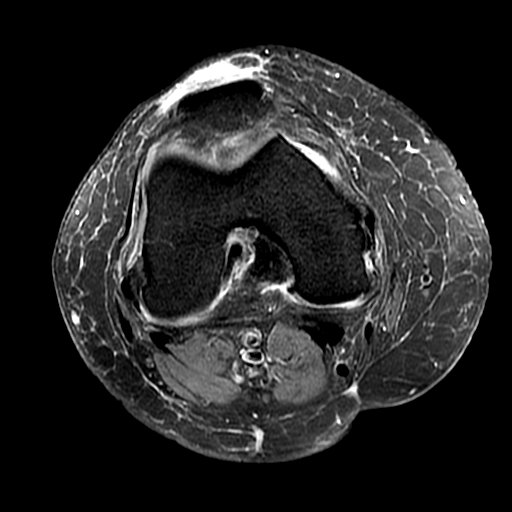
[im 20/34]
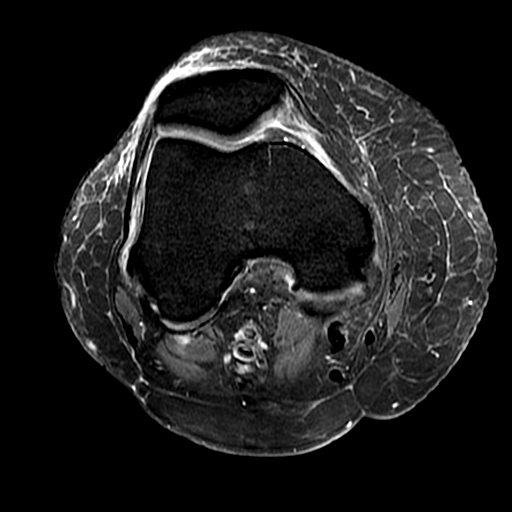
[im 24/34]
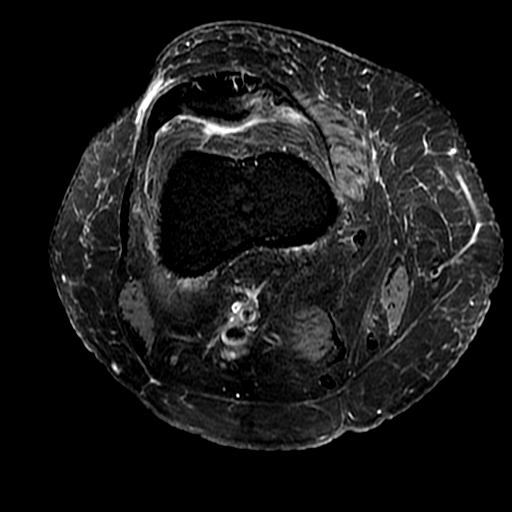
[im 30/34]
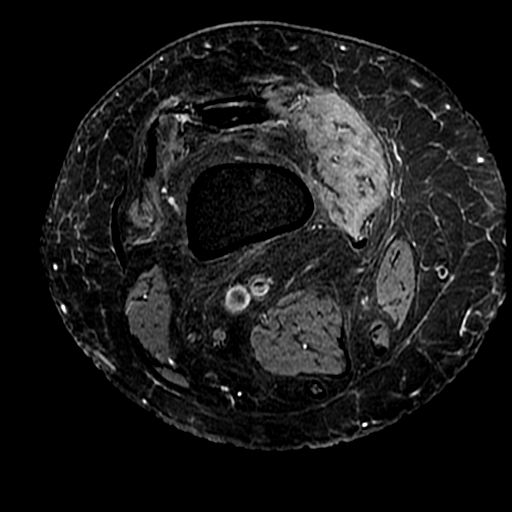

[Series 5: PD fat-sat · sagittal · 3.5mm · 0.31mm/px · 3 of 25 slices shown (2 of 3)]
[im 4/25]
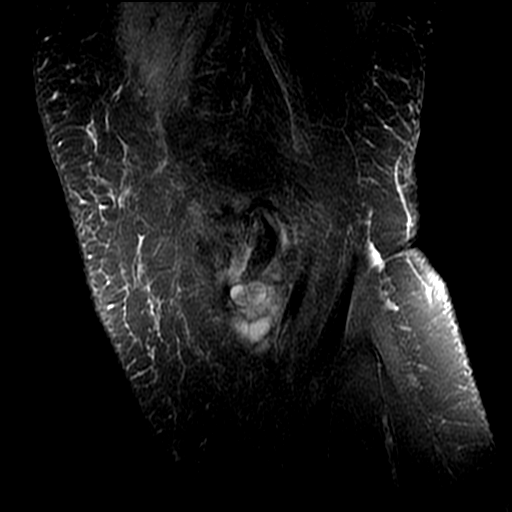
[im 14/25]
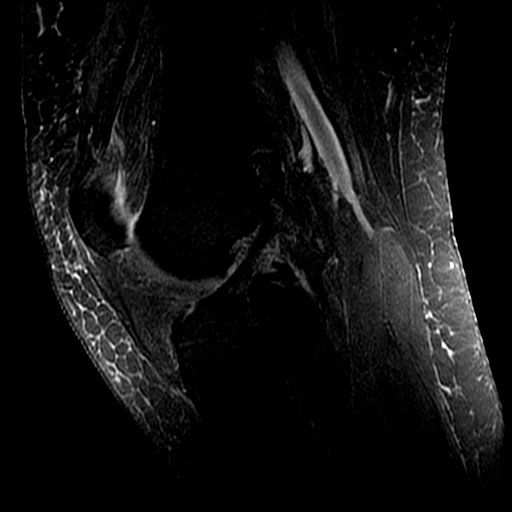
[im 21/25]
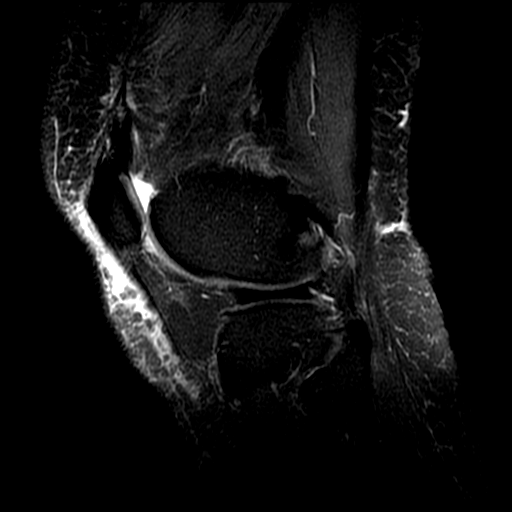

[Series 6: PD fat-sat · coronal · 3.5mm · 0.31mm/px · 3 of 27 slices shown (3 of 3)]
[im 4/27]
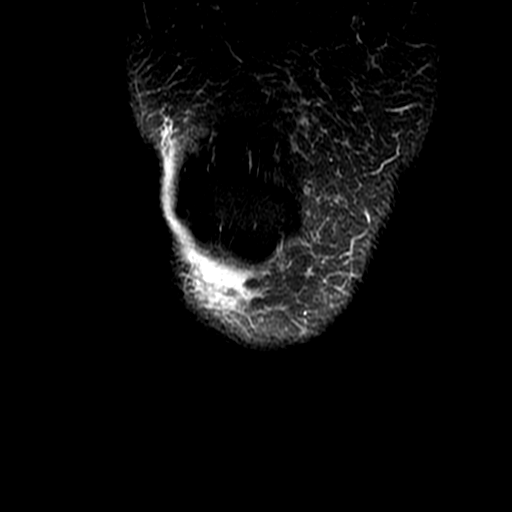
[im 14/27]
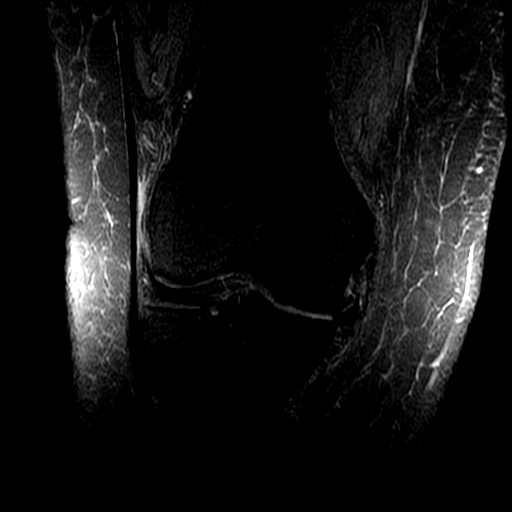
[im 23/27]
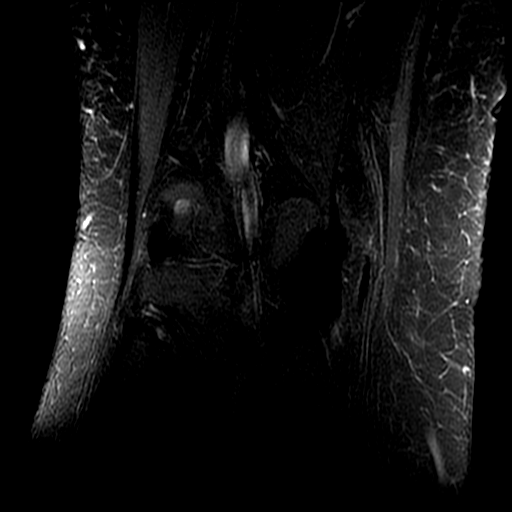

[Series 7: T1 · coronal · 3.5mm · 0.31mm/px · 3 of 27 slices shown]
[im 4/27]
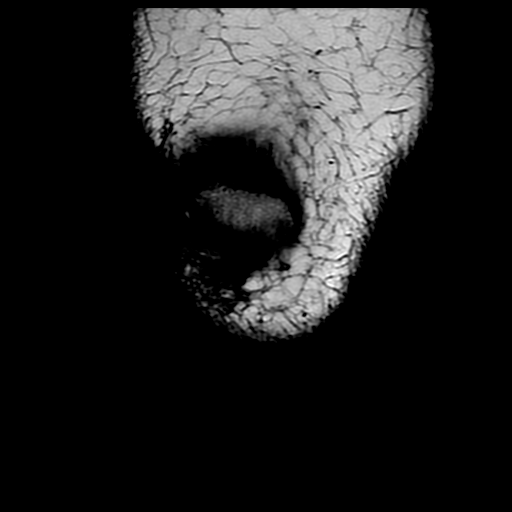
[im 14/27]
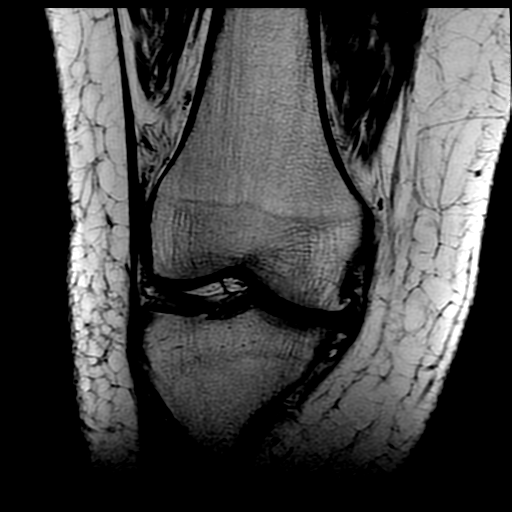
[im 23/27]
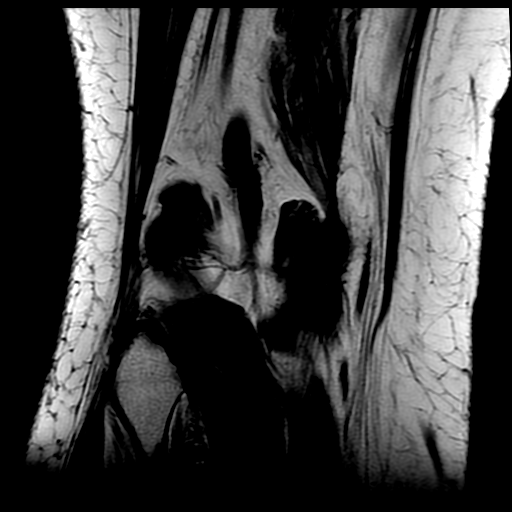

[18 of 40 positions shown; findings below may reference images not displayed]

FINDINGS: MEDIAL COMPARTMENT: Abnormal appearance of the medial meniscus. There is complex 
tearing with a macerated appearance of the mid to posterior body and posterior 
horn the level the meniscal root. There is peripheral extrusion of greater than 
3 mm indicative of loss of hoop containment fibers. There is associated para 
labral cyst adjacent to the posterior body/posterior horn extending over an SI 
distance of approximately 2.8 cm measuring up to approximately 2.7 cm TR. 
Moderately advanced articular cartilaginous loss, grade 3 to nearly grade 4 
involving the periphery. Scattered marginal osteophytic spurring and mild 
subcortical marrow edema. 
LATERAL COMPARTMENT: The lateral meniscus is intact without tear or extrusion. 
Articular cartilage is preserved. 
PATELLOFEMORAL COMPARTMENT: The patella is centrally located. Mild articular 
cartilaginous loss of the medial patellar facet, grade 2. 
TIBIOFIBULAR COMPARTMENT: Negative. 
LIGAMENTS: The anterior cruciate ligament is intact. The posterior cruciate 
ligament is intact. The medial collateral ligament and lateral collateral 
ligaments are preserved. 
EXTENSOR MECHANISM: The quadriceps and patellar tendon are preserved. The medial 
and lateral retinacula are intact. 
POSTEROMEDIAL CORNER: The semimembranosus and pes anserine tendons are 
preserved. Partial tearing with a thickened edematous appearance of the 
posterior medial capsule. 
POSTEROLATERAL CORNER: The popliteal tendon and popliteofibular ligament are 
intact. The biceps femoris is negative. 
BONES: Normal bone marrow signal intensity. No fracture or contusion or stress 
response.  
ADDITIONAL FINDINGS: No knee joint effusion. No popliteal cyst. The musculature 
is normal without mass, signal abnormality or atrophy. Neurovascular bundles are 
negative. There is scattered subcutaneous edema greatest anteriorly.
IMPRESSION: 1.  Advanced degenerative changes of the knee with greatest involvement of the 
medial compartment. 
2.  Complex tearing with a macerated appearance of the medial meniscus with 
peripheral extrusion and associated paralabral cyst and partial tearing of the 
posterior medial capsule.

## 2020-10-19 ENCOUNTER — Encounter (INDEPENDENT_AMBULATORY_CARE_PROVIDER_SITE_OTHER): Payer: Self-pay | Admitting: PHYSICIAN ASSISTANT

## 2020-10-19 ENCOUNTER — Other Ambulatory Visit: Payer: Self-pay

## 2020-10-19 ENCOUNTER — Ambulatory Visit: Payer: Medicare Other | Attending: PHYSICIAN ASSISTANT | Admitting: PHYSICIAN ASSISTANT

## 2020-10-19 VITALS — BP 132/74 | HR 76 | Temp 97.3°F | Ht 66.0 in | Wt 228.0 lb

## 2020-10-19 DIAGNOSIS — E039 Hypothyroidism, unspecified: Secondary | ICD-10-CM | POA: Insufficient documentation

## 2020-10-19 NOTE — Patient Instructions (Signed)
1.  TSH added this morning  2.  Will await the results and decide if we change the dose  3.  TSH, Free T4 early April

## 2020-10-19 NOTE — Progress Notes (Signed)
[facility]  [address]  [place] [address]  Operated by [facility]  Progress Note    Name: Sheri Kemp MRN:  [id number]   Date: 10/19/2020 Age: 70 y.o.       Subjective:     Patient ID:   Sheri Kemp is an 70 y.o. female.      Chief Complaint:      Chief Complaint   Patient presents with   . Hypothyroidism     F/u. Spoke to lab at [place] and they did not do a TSH but was going to add on today.      TSH- 2.180 to 5.380 to 0.301 and now not done  Free T4- 1.35 to 1.09 to 0.82    Weight watchers started at the end of August    Moderate Aortic Stenosis by MRI of heart but not bad enough to do surgery  Had sleep study and showed Mild OSA.  And using CPAP.  It has helped her.  Had a Holter monitor and she had PVCs and SVTs.  Tried cardizem and not helpful.  Now doing metoprolol  Doing physical therapy now    Review of Systems   Constitutional: Negative for weight loss.   Cardiovascular: Negative for chest pain and palpitations.       Objective:   Physical Exam  Neck:      Thyroid: Thyroid normal.      Vascular: No carotid bruit.   Cardiovascular:      Rate and Rhythm: Regular rhythm.   Musculoskeletal:      Cervical back: Neck supple.   Lymphadenopathy:   No no anterior cervical adenopathy or no posterior cervical adenopathy.    Upper Body: No supraclavicular adenopathy is present.     Pulm:    Lungs are clear to auscultation.    Thyroid:  Thyroid normal.                Assessment & Plan:       ICD-10-CM    1. Hypothyroidism, adult  E03.9      Plan:  1.  TSH added this morning  2.  Will await the results and decide if we change the dose  3.  TSH, Free T4 early April      Latorie Montesano, [place]

## 2020-10-20 ENCOUNTER — Other Ambulatory Visit (INDEPENDENT_AMBULATORY_CARE_PROVIDER_SITE_OTHER): Payer: Self-pay | Admitting: PHYSICIAN ASSISTANT

## 2020-10-20 ENCOUNTER — Telehealth (INDEPENDENT_AMBULATORY_CARE_PROVIDER_SITE_OTHER): Payer: Self-pay | Admitting: PHYSICIAN ASSISTANT

## 2020-10-20 DIAGNOSIS — E039 Hypothyroidism, unspecified: Secondary | ICD-10-CM

## 2020-10-20 MED ORDER — LEVOTHYROXINE 125 MCG TABLET
125.0000 ug | ORAL_TABLET | Freq: Every morning | ORAL | 4 refills | Status: DC
Start: 2020-10-20 — End: 2021-05-07

## 2020-10-20 NOTE — Telephone Encounter (Signed)
Patient informed. 

## 2020-12-20 ENCOUNTER — Other Ambulatory Visit: Payer: Self-pay

## 2020-12-20 ENCOUNTER — Other Ambulatory Visit: Payer: Medicare Other | Attending: PHYSICIAN ASSISTANT

## 2020-12-20 DIAGNOSIS — E039 Hypothyroidism, unspecified: Secondary | ICD-10-CM | POA: Insufficient documentation

## 2020-12-21 LAB — THYROXINE, FREE (FREE T4): THYROXINE (T4), FREE: 1.45 ng/dL (ref 0.78–2.10)

## 2020-12-21 LAB — THYROID STIMULATING HORMONE (SENSITIVE TSH): TSH: 0.222 u[IU]/mL — ABNORMAL LOW (ref 0.465–4.680)

## 2020-12-25 ENCOUNTER — Telehealth (INDEPENDENT_AMBULATORY_CARE_PROVIDER_SITE_OTHER): Payer: Self-pay | Admitting: PHYSICIAN ASSISTANT

## 2020-12-25 NOTE — Patient Instructions (Signed)
No change in dose at this time due to normal T4.  Will assess at next visit

## 2020-12-25 NOTE — Telephone Encounter (Addendum)
Patient had labs done last week here. TSh is low please advise on med dose      Patient informed

## 2020-12-31 IMAGING — MR MRI RIGHT SHOULDER WITHOUT CONTRAST
4 of 6 series · 22 of 40 positions shown · IV contrast (gadolinium)
Comparison: None

________________________________________________________________________________________________ 
MRI RIGHT SHOULDER WITHOUT CONTRAST, 12/31/2020 [DATE]: 
CLINICAL INDICATION: Pain in the right shoulder. Pain between right shoulder and 
elbow for a few weeks. No acute trauma.
TECHNIQUE: Multiplanar, multiecho position MR images of the shoulder were 
performed without intravenous gadolinium enhancement. Patient was scanned on a 
1.5T magnet.

[Series 201: survey mst · axial · 10.0mm · 0.99mm/px · z∈[-40,+175]mm · 4 of 15 slices shown]
[im 1/15]
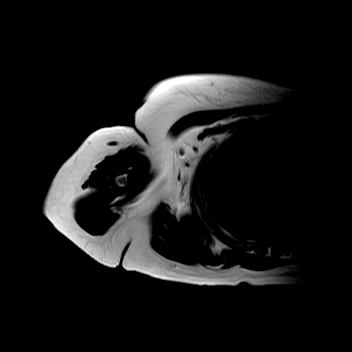
[im 5/15]
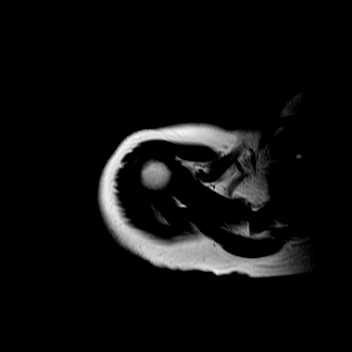
[im 10/15]
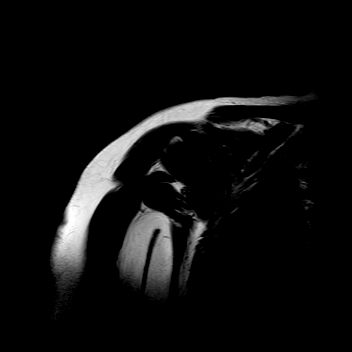
[im 15/15]
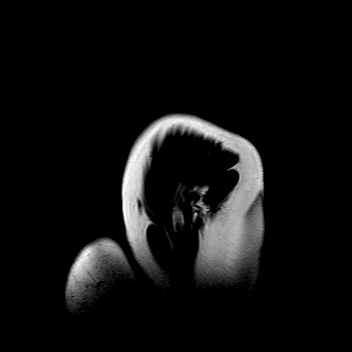

[Series 301: (person_name)_(person_name)_(person_name)* · axial · 3.0mm · 0.35mm/px · z∈[-25,+62]mm · 8 of 28 slices shown]
[im 1/28]
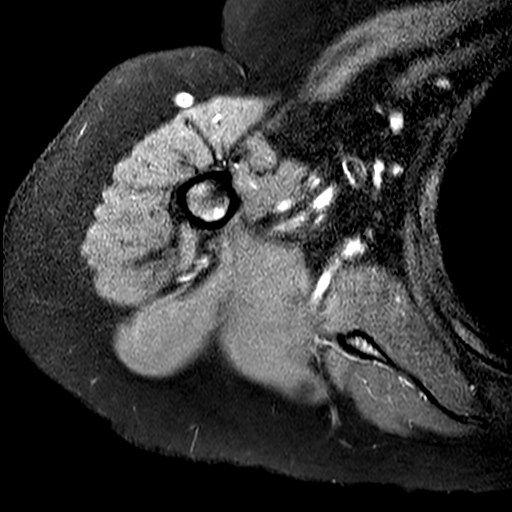
[im 4/28]
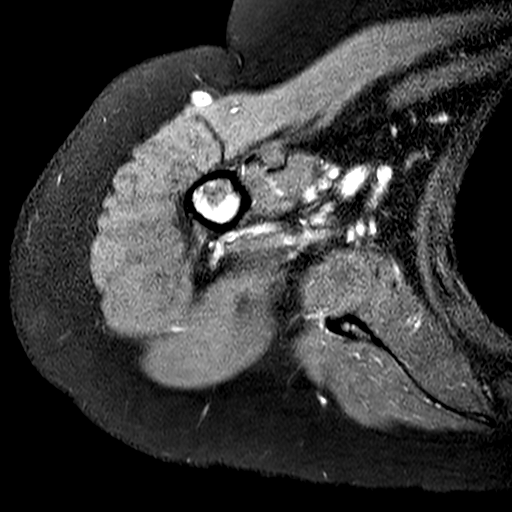
[im 8/28]
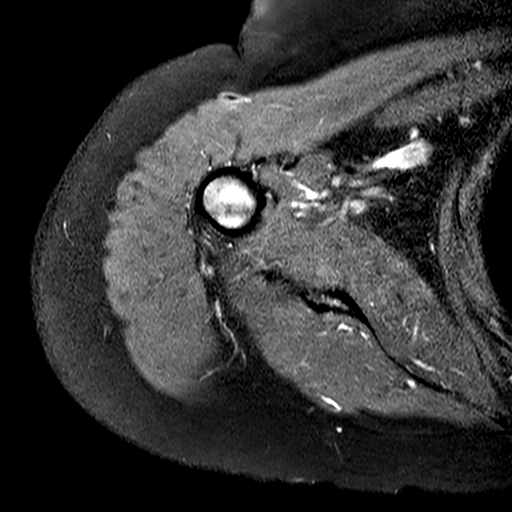
[im 12/28]
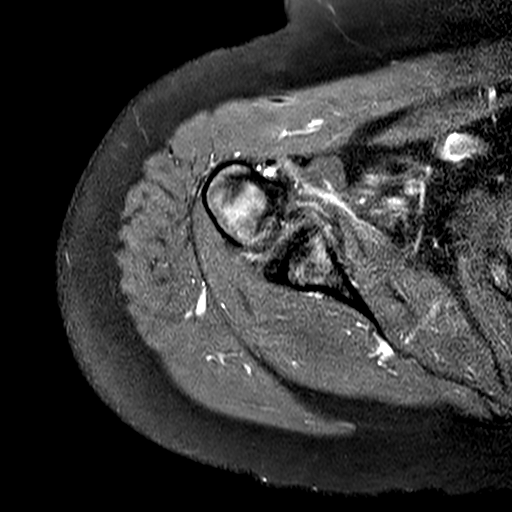
[im 16/28]
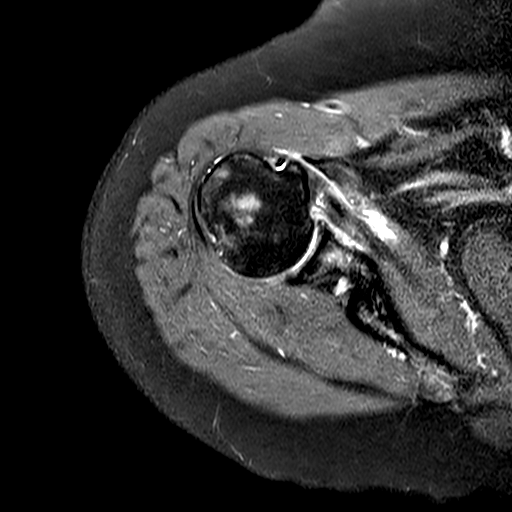
[im 20/28]
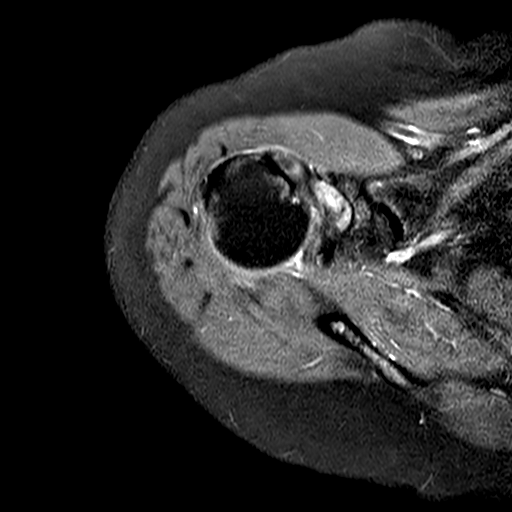
[im 24/28]
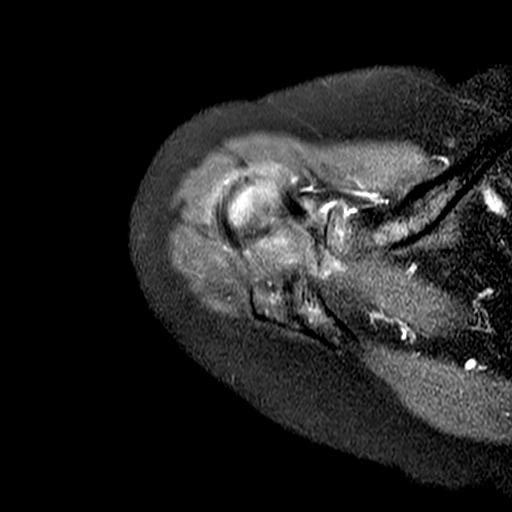
[im 28/28]
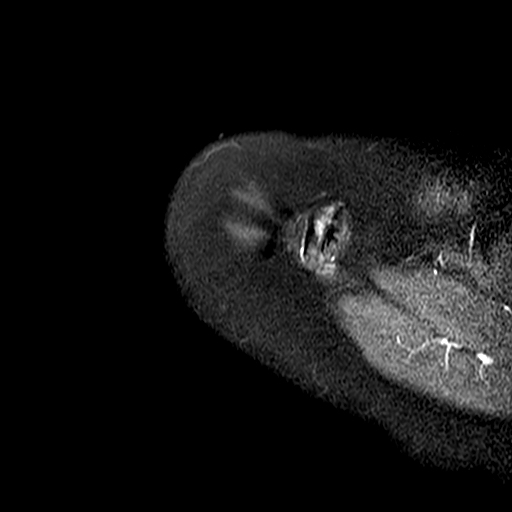

[Series 401: t2_fs_sag* · oblique · 3.0mm · 0.37mm/px · 7 of 27 slices shown]
[im 1/27]
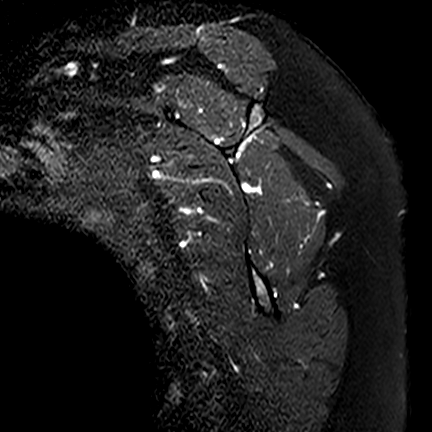
[im 4/27]
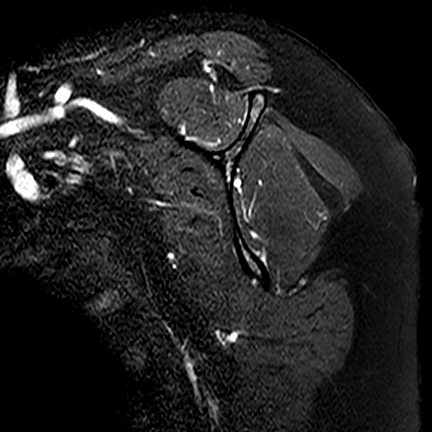
[im 8/27]
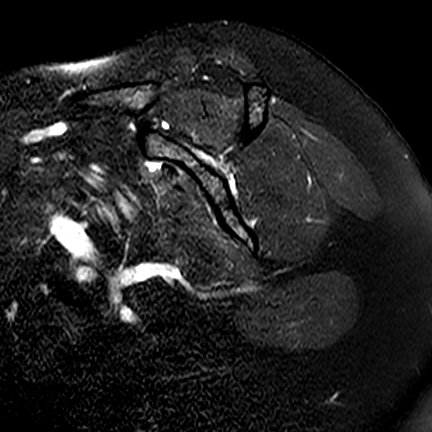
[im 12/27]
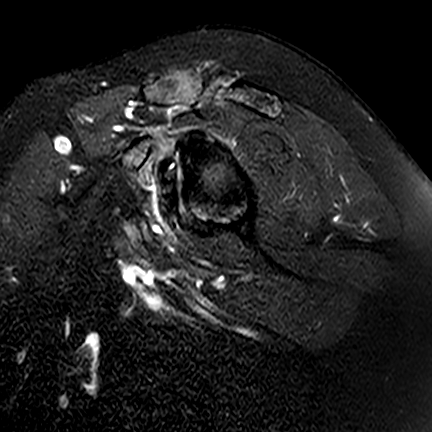
[im 15/27]
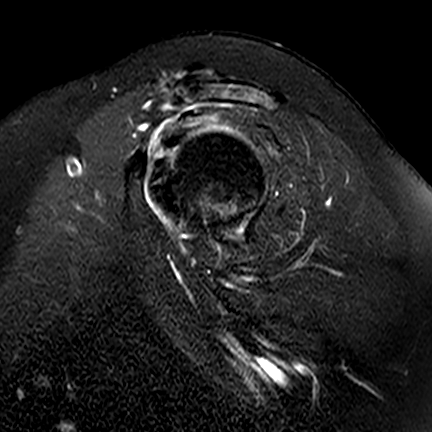
[im 19/27]
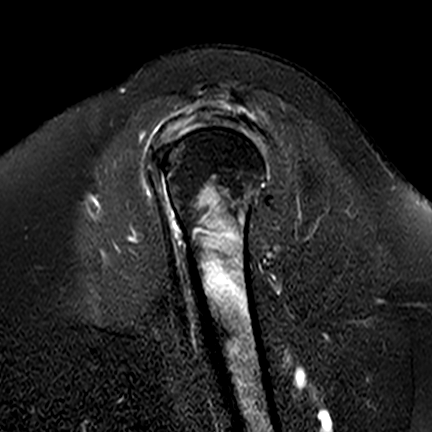
[im 23/27]
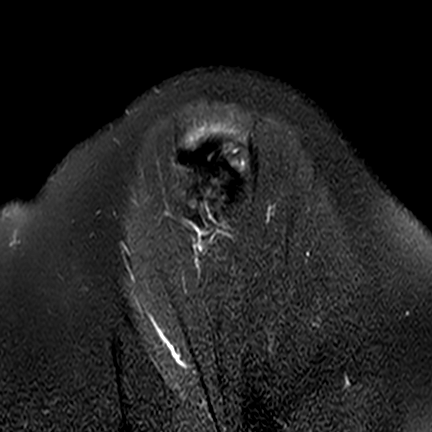

[Series 501: t1_sag* · oblique · 3.0mm · 0.28mm/px · 3 of 27 slices shown]
[im 4/27]
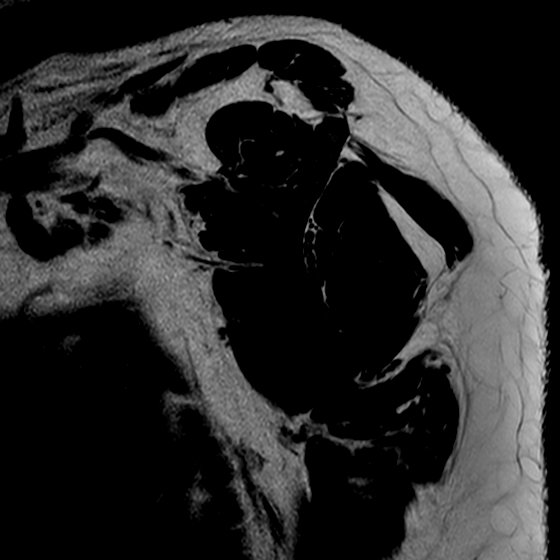
[im 15/27]
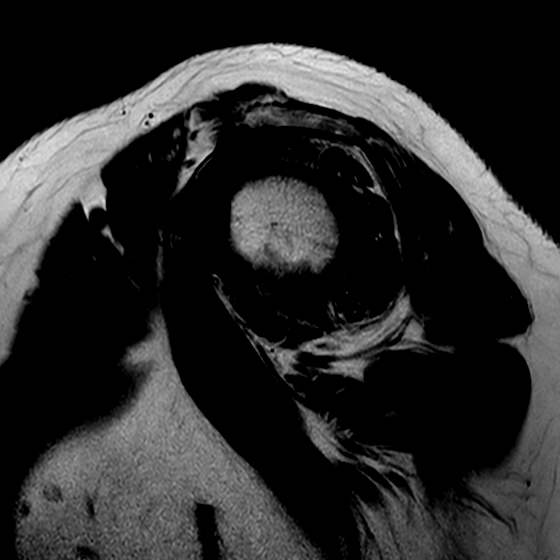
[im 23/27]
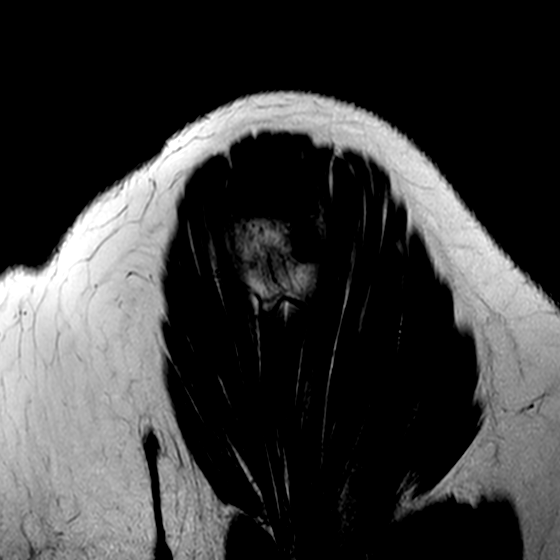

[22 of 40 positions shown; findings below may reference images not displayed]

FINDINGS: ROTATOR CUFF: There is intrasubstance signal abnormality with thickening of the 
distal supraspinatus and infraspinatus tendons and the subscapularis compatible 
with tendinosis. The supraspinatus, infraspinatus, subscapularis and teres minor 
tendons are otherwise intact. No focal high-grade partial or full-thickness 
tear. The rotator cuff musculature is symmetric without mass, signal abnormality 
or atrophy. 
ACROMIOCLAVICULAR JOINT: Mild hypertrophic changes of the acromioclavicular 
joint. There is mild mass effect upon the underlying myotendinous juncture of 
the supraspinatus. There is mild spurring of the acromion at the coracoacromial 
ligamentous attachment. Trace fluid within the subacromial-subdeltoid space. 
GLENOHUMERAL JOINT: Mild to moderate articular cartilaginous loss of the 
glenohumeral articulation. There is mild fraying of the glenoid labrum greatest 
anteriorly. There is partial tearing of the anterior inferior capsule and mild 
capsular thickening and pericapsular edema. The intra-articular portion of the 
long head of the biceps tendon is negative. Small shoulder joint effusion. 
BONES: The bone marrow signal intensity is negative for fracture. No Hill-Sachs 
defect. 
ADDITIONAL FINDINGS: The axillary region is negative. Subcutaneous tissues are 
negative.
IMPRESSION: 1.  Moderate AC joint arthropathy with mild narrowing of the supraspinatus 
outlet. 
2.  Trace subacromial-subdeltoid bursitis. 
3.  Rotator cuff tendinosis. 
4.  Mild to moderate degenerative changes of the glenohumeral articulation.

## 2021-05-03 ENCOUNTER — Other Ambulatory Visit: Payer: Self-pay

## 2021-05-03 ENCOUNTER — Other Ambulatory Visit: Payer: Medicare Other | Attending: PHYSICIAN ASSISTANT

## 2021-05-03 DIAGNOSIS — E039 Hypothyroidism, unspecified: Secondary | ICD-10-CM | POA: Insufficient documentation

## 2021-05-04 LAB — THYROXINE, FREE (FREE T4): THYROXINE (T4), FREE: 1.09 ng/dL (ref 0.78–2.10)

## 2021-05-04 LAB — THYROID STIMULATING HORMONE (SENSITIVE TSH): TSH: 10.4 u[IU]/mL — ABNORMAL HIGH (ref 0.465–4.680)

## 2021-05-07 ENCOUNTER — Ambulatory Visit: Payer: Medicare Other | Attending: PHYSICIAN ASSISTANT | Admitting: PHYSICIAN ASSISTANT

## 2021-05-07 ENCOUNTER — Encounter (INDEPENDENT_AMBULATORY_CARE_PROVIDER_SITE_OTHER): Payer: Self-pay | Admitting: PHYSICIAN ASSISTANT

## 2021-05-07 ENCOUNTER — Other Ambulatory Visit: Payer: Self-pay

## 2021-05-07 VITALS — BP 134/78 | HR 80 | Temp 98.2°F | Ht 66.0 in | Wt 224.0 lb

## 2021-05-07 DIAGNOSIS — E039 Hypothyroidism, unspecified: Secondary | ICD-10-CM | POA: Insufficient documentation

## 2021-05-07 DIAGNOSIS — I350 Nonrheumatic aortic (valve) stenosis: Secondary | ICD-10-CM

## 2021-05-07 MED ORDER — LEVOTHYROXINE 137 MCG TABLET
137.0000 ug | ORAL_TABLET | Freq: Every morning | ORAL | 0 refills | Status: DC
Start: 2021-05-07 — End: 2021-07-05

## 2021-05-07 NOTE — Patient Instructions (Signed)
Plan:  1.  Increase levothyroxine 137 mcg daily  2.  Discussed brand synthroid or tirosint but concern of cost  3.  TSH, Free T4 end July and middle October

## 2021-05-07 NOTE — Progress Notes (Signed)
[facility]  [address]  [place] [address]  Operated by [facility]  Progress Note    Name: Sheri Kemp MRN:  [id number]   Date: 05/07/2021 Age: 71 y.o.       Subjective:     Patient ID:   Sheri Kemp is an 71 y.o. female.      Chief Complaint:      Chief Complaint   Patient presents with   . Hypothyroidism     F/u      TSH- 2.180 to 5.380 to 0.301 and 0.222 to 10.40  Free T4- 1.35 to 1.09 to 0.82 to 1.45 to 1.09    4 lbs weight loss  Weight watchers started at the end of August  Has not been following as before    March 2023 echocardiogram showed moderate aortic stenosis.      Review of Systems   Constitutional: Positive for malaise/fatigue. Negative for weight loss.   Cardiovascular: Negative for chest pain and palpitations.   Endo/Heme/Allergies:        Heat intolerance       Objective:   Physical Exam  Neck:      Thyroid: Thyroid normal.      Vascular: No carotid bruit.   Cardiovascular:      Rate and Rhythm: Regular rhythm.   Musculoskeletal:      Cervical back: Neck supple.   Lymphadenopathy:   No no anterior cervical adenopathy or no posterior cervical adenopathy.    Upper Body: No supraclavicular adenopathy is present.     Pulm:    Lungs are clear to auscultation.    Thyroid:  Thyroid normal.                Assessment & Plan:       ICD-10-CM    1. Hypothyroidism, adult  E03.9         Plan:  1.  Increase levothyroxine 137 mcg daily  2.  Discussed brand synthroid or tirosint but concern of cost  3.  TSH, Free T4 end July and middle October      Fortunata Betty [place], [place]

## 2021-06-15 IMAGING — MG MAMMOGRAPHY SCREENING BILATERAL 3[PERSON_NAME]
8 series · 8 of 24 positions shown · non-contrast
Comparison: Comparison was made to prior examinations.

________________________________________________________________________________________________ 
MAMMOGRAPHY SCREENING BILATERAL 3SILVESTER MOSKOWITZ, 06/15/2021 [DATE]: 
CLINICAL INDICATION: Encounter for screening mammogram.
TECHNIQUE: Digital bilateral mammograms and 3-D Tomosynthesis were obtained. 
These were interpreted both primarily and with the aid of computer-aided 
detection system.  
BREAST DENSITY: (Level B) There are scattered areas of fibroglandular density.

[L MLO]
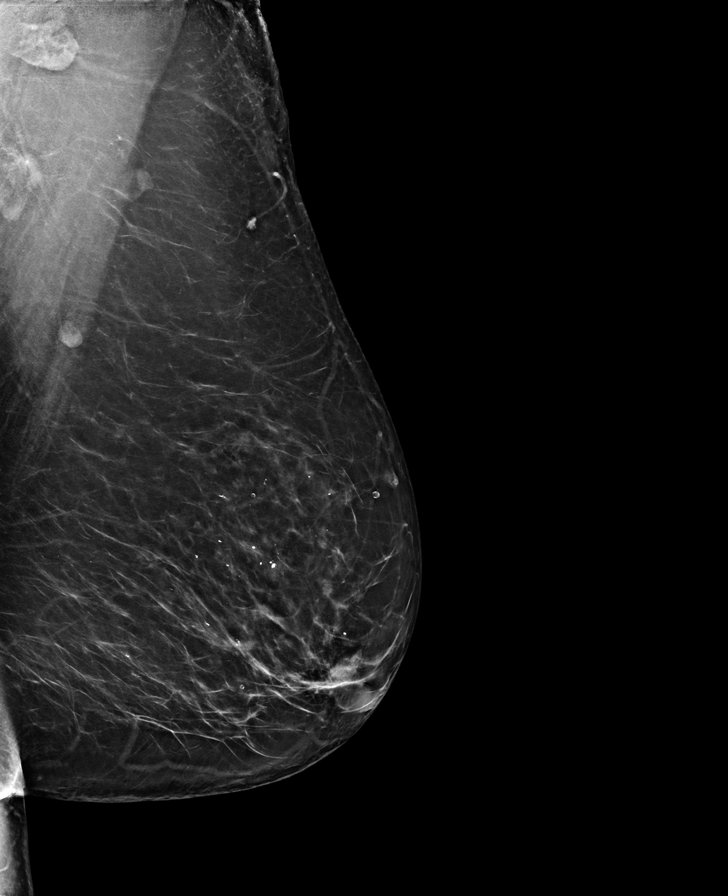

[L CC]
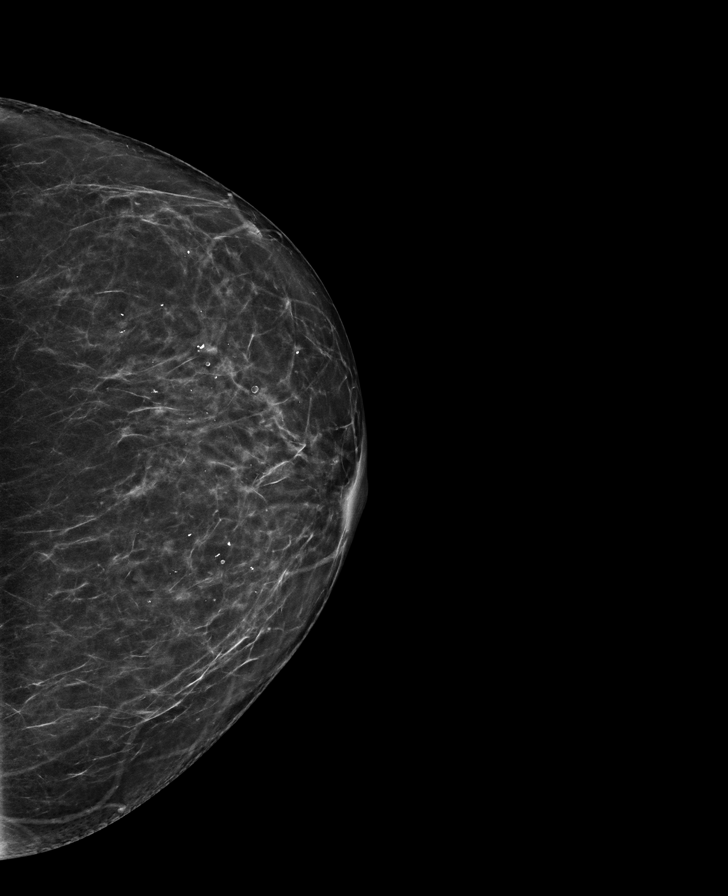

[R MLO]
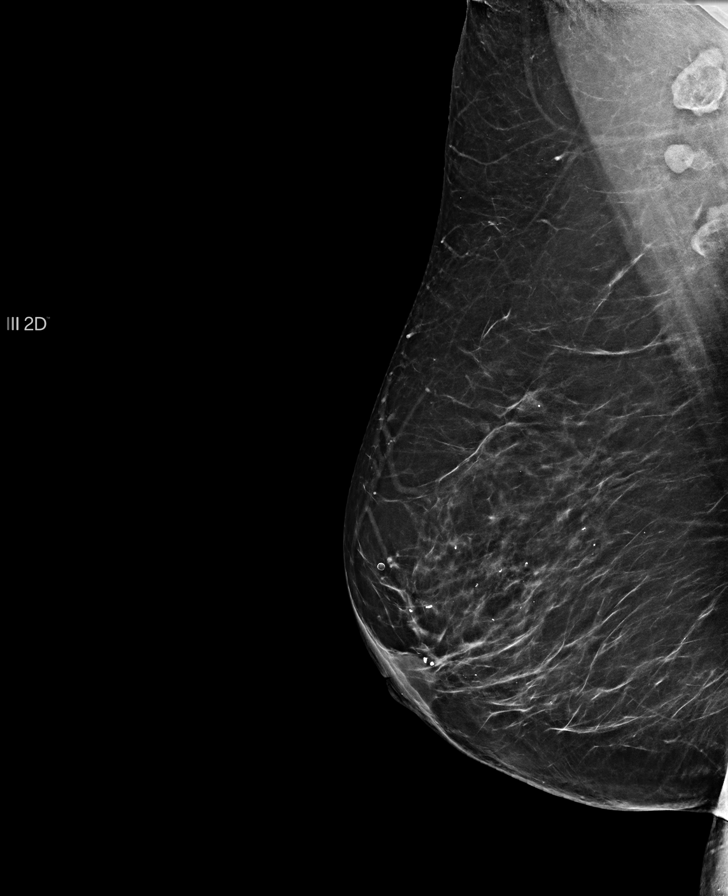

[R CC]
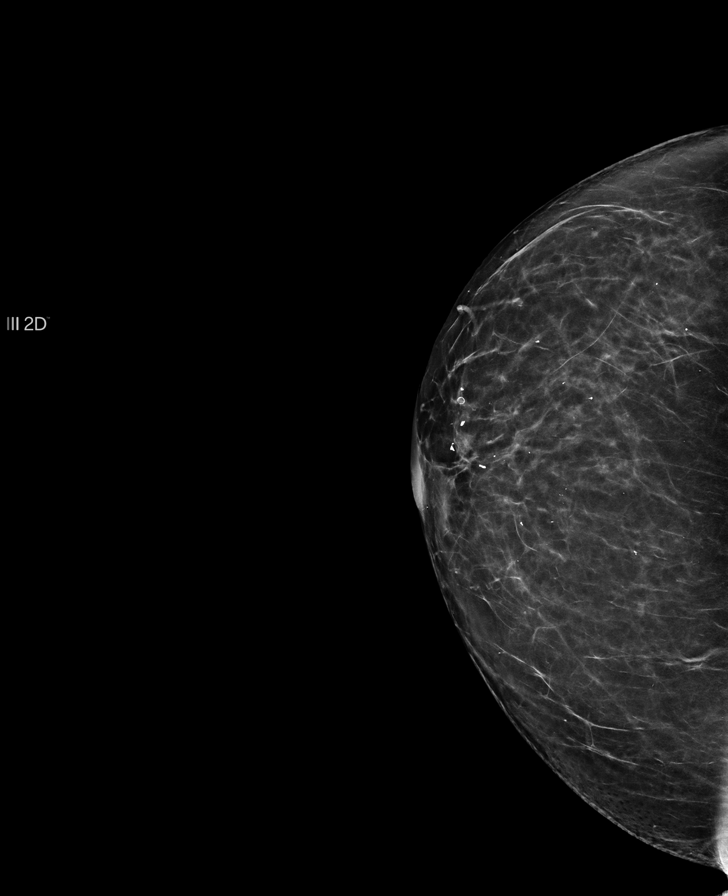

[L CC tomo · tomo slice 34/67.0]
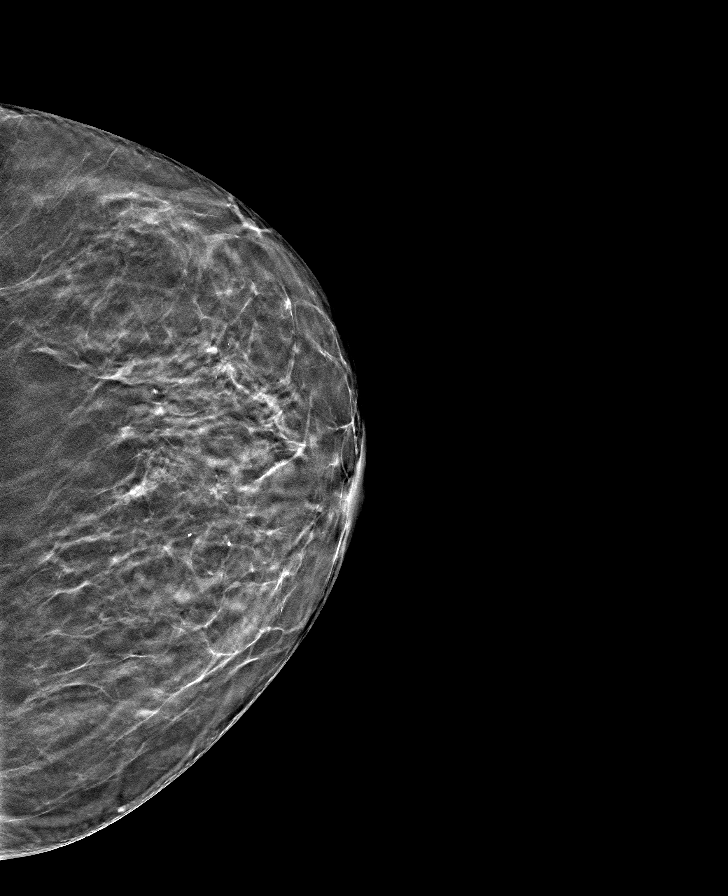

[R MLO tomo · tomo slice 43/85.0]
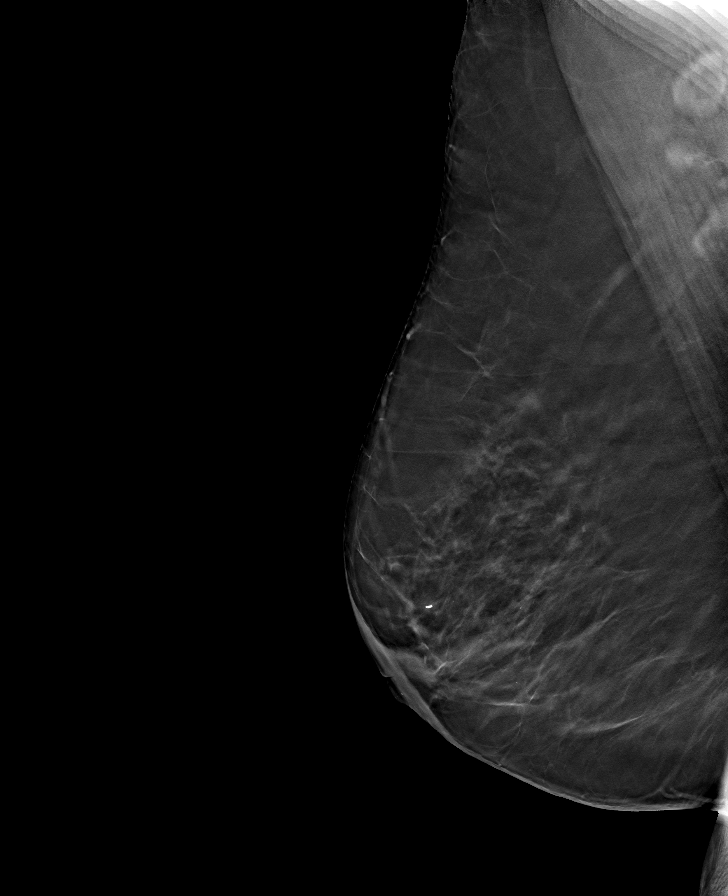

[L MLO tomo · tomo slice 44/87.0]
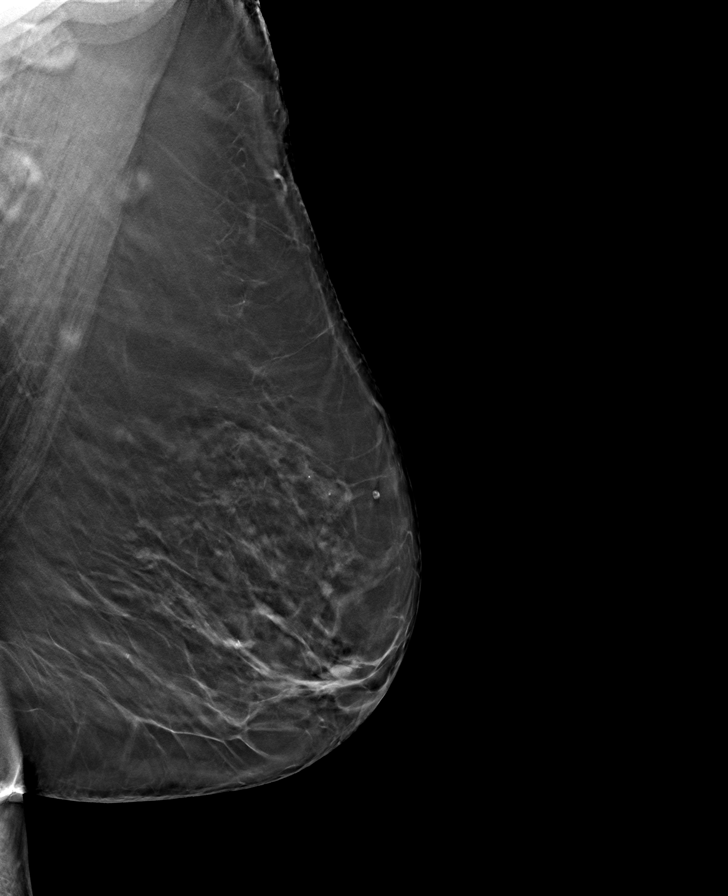

[R CC tomo · tomo slice 34/67.0]
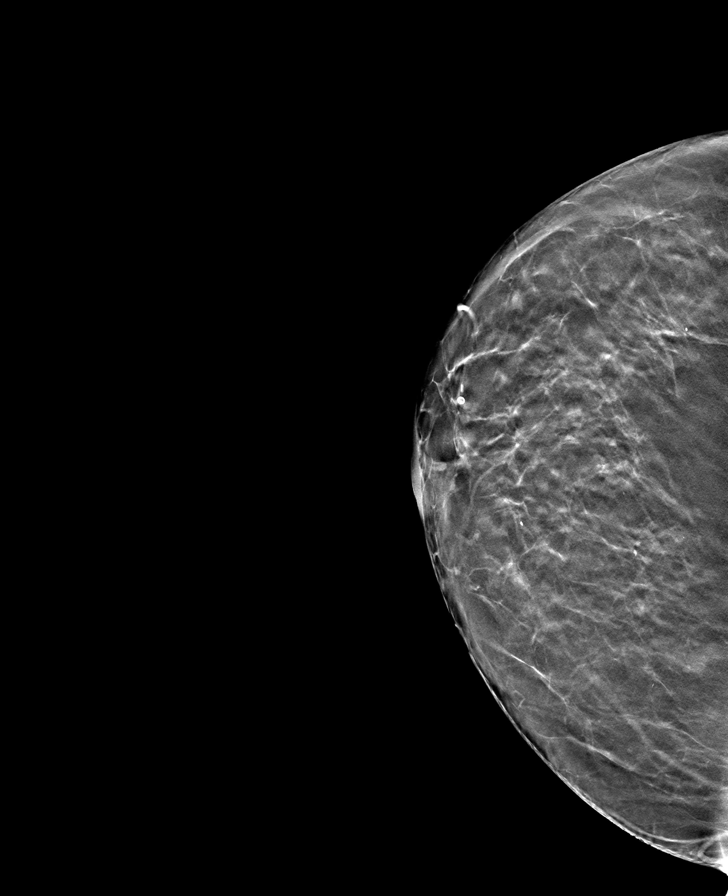

[8 of 24 positions shown; findings below may reference images not displayed]

FINDINGS: No suspicious mass, calcifications, or area of architectural 
distortion in either breast.
IMPRESSION: (BI-RADS 2) Benign findings. Routine mammographic follow-up is recommended.

## 2021-06-23 ENCOUNTER — Encounter (INDEPENDENT_AMBULATORY_CARE_PROVIDER_SITE_OTHER): Payer: Self-pay | Admitting: PHYSICIAN ASSISTANT

## 2021-06-29 ENCOUNTER — Other Ambulatory Visit (INDEPENDENT_AMBULATORY_CARE_PROVIDER_SITE_OTHER): Payer: Self-pay | Admitting: PHYSICIAN ASSISTANT

## 2021-06-29 DIAGNOSIS — E039 Hypothyroidism, unspecified: Secondary | ICD-10-CM

## 2021-07-02 ENCOUNTER — Other Ambulatory Visit: Payer: Self-pay

## 2021-07-02 ENCOUNTER — Other Ambulatory Visit: Payer: Medicare Other | Attending: PHYSICIAN ASSISTANT

## 2021-07-02 DIAGNOSIS — E039 Hypothyroidism, unspecified: Secondary | ICD-10-CM | POA: Insufficient documentation

## 2021-07-03 LAB — TRIIODOTHYRONINE, TOTAL (TOTAL T3): T3 TOTAL: 152 ng/dL (ref 97–169)

## 2021-07-03 LAB — THYROID STIMULATING HORMONE (SENSITIVE TSH): TSH: <0.015 u[IU]/mL — ABNORMAL LOW (ref 0.465–4.680)

## 2021-07-03 LAB — THYROXINE, FREE (FREE T4): THYROXINE (T4), FREE: 1.97 ng/dL (ref 0.78–2.10)

## 2021-07-05 ENCOUNTER — Encounter (INDEPENDENT_AMBULATORY_CARE_PROVIDER_SITE_OTHER): Payer: Self-pay | Admitting: PHYSICIAN ASSISTANT

## 2021-07-05 ENCOUNTER — Other Ambulatory Visit (INDEPENDENT_AMBULATORY_CARE_PROVIDER_SITE_OTHER): Payer: Self-pay | Admitting: PHYSICIAN ASSISTANT

## 2021-07-05 DIAGNOSIS — E039 Hypothyroidism, unspecified: Secondary | ICD-10-CM

## 2021-07-05 MED ORDER — LEVOTHYROXINE 137 MCG TABLET
ORAL_TABLET | ORAL | 3 refills | Status: DC
Start: 2021-07-05 — End: 2021-07-06

## 2021-07-05 NOTE — Result Encounter Note (Signed)
125 mcg daily was not enough  137 mcg daily is too much  Would like to try 137 mcg daily 6 days/week and 1/2 tablet on Sundays  Repeat labs in 6 to 8 weeks

## 2021-07-05 NOTE — Result Encounter Note (Signed)
Patient informed. 

## 2021-07-06 MED ORDER — SYNTHROID 137 MCG TABLET
137.0000 ug | ORAL_TABLET | Freq: Every morning | ORAL | 3 refills | Status: DC
Start: 2021-07-06 — End: 2021-11-12

## 2021-07-06 NOTE — Telephone Encounter (Signed)
From: [name]  To: Kinnie Scales  Sent: 07/05/2021 3:51 PM EDT  Subject: My labs    Dear Sheri Kemp, Just spoke w staff , she said one too high other too low. Very confused my present dose of 137 mcg caused my TSH to drop how continuing (6 days half on .7 th day half) will correct?  Do I need an ultrasound of my thyroid?  Very frustrated. Not making progress at all.   I went looked at my labs basically q 6 mos it elevated then it is low. I was diagnosed w Hashimoto by Dr Nicholes Mango back over 18 yrs   I am trying to get my original lab results to see antibody + other labs done yrs ago( patient portal did not exist that long ago. ) After your staff called I cried. We would not have even know the TSH lab till mid July if I had not reached out telling you how horrible I feel. Sheri Kemp

## 2021-08-01 ENCOUNTER — Encounter (INDEPENDENT_AMBULATORY_CARE_PROVIDER_SITE_OTHER): Payer: Self-pay | Admitting: PHYSICIAN ASSISTANT

## 2021-09-21 NOTE — Progress Notes (Signed)
 New patient for thyroid   Labs done 12/20/20  Records in care everywhere

## 2021-09-25 NOTE — Progress Notes (Signed)
Subjective    Patient ID: Sheri Kemp  is a 71 y.o. female seen for an initial evaluation.    HPI  Patient presents for recommendations regarding -   Another opinion on fluctuating thyroid levels.    Long-standing h/o hypothyroidism. Was seeing Dr. Lonni Fix, then his PA.  Dose has varied. Most recently changed to [facility] and reduced to 6.5 tabs a week.  Takes daily but at night because likes her coffee in am.  No h/o celiac or IBD.    The following portions of the patient's history were reviewed and updated as appropriate: allergies, current medications, past family history, past medical history, past social history, past surgical history and problem list.  Past Medical History:   Diagnosis Date   . Aortic stenosis    . GERD (gastroesophageal reflux disease)    . Hyperlipidemia    . Hypertension    . Hypothyroidism      Past Surgical History:   Procedure Laterality Date   . BREAST BIOPSY     . CYSTOCELE REPAIR     . EYE SURGERY     . FOOT SURGERY     . KNEE ARTHROSCOPY Left    . REPAIR RECTOCELE       Family History   Problem Relation Age of Onset   . Cancer Mother    . Alzheimer's disease Father      Social History     Socioeconomic History   . Marital status: Married     Spouse name: Not on file   . Number of children: Not on file   . Years of education: Not on file   . Highest education level: Not on file   Occupational History   . Not on file   Tobacco Use   . Smoking status: Never   . Smokeless tobacco: Never   Vaping Use   . Vaping Use: Never used   Substance and Sexual Activity   . Alcohol use: Yes     Comment: occasional   . Drug use: Never   . Sexual activity: Not on file   Other Topics Concern   . Not on file   Social History Narrative   . Not on file     Social Determinants of Health     Financial Resource Strain: Not on file   Food Insecurity: Not on file   Stress: Not on file   Social Connections: Not on file     Home medications:  atorvastatin, cholecalciferol (vitamin D3), docusate, (estrogens, conjugated),  levothyroxine, magnesium oxide, and metoprolol succinate ER  Allergies   Allergen Reactions   . Fentanyl    . Penicillins        Objective    Review of Systems   Constitutional: Positive for fatigue and unexpected weight change (gain).   HENT: Negative.    Eyes: Negative.    Respiratory: Negative.    Cardiovascular: Negative.    Gastrointestinal: Negative.    Endocrine: Negative.    Musculoskeletal: Negative.    Skin: Negative.    Neurological: Negative.    Psychiatric/Behavioral: Negative.         Objective:  Vitals:    09/25/21 1043   BP: 145/82   Pulse: 75   Resp: 16   Temp: 96.5 F (35.8 C)   SpO2: 98%     Wt Readings from Last 3 Encounters:   09/25/21 (!) 101.6 kg (224 lb)   04/09/21 (!) 101.6 kg (224 lb)   04/09/21 Marland Kitchen)  101.2 kg (223 lb)       Physical Exam:  General:  alert, cooperative and no distress    Eyes:  No exophtalmos   Neck:  no adenopathy,supple, symmetrical, trachea midlinesymmetric, no tenderness/mass   Thyroid:  no palpable nodule    Lung:  clear to auscultation bilaterally    Heart:  regular rate and rhythm, S1, S2 normal, no murmur, click, rub or gallop    Abdomen:  soft, non-tender   Extremities:  extremities normal, atraumatic, no cyanosis or edema    Skin:  warm and dry, no hyperpigmentation, vitiligo, or suspicious lesions    Pulses:  2+ and symmetric    Neuro:  normal without focal findings, mental status, speech normal, alert and oriented x3     Labs  Previous records are available and reviewed.        Assessment/Plan   Assessment/Plan:    Hypothyroidism, acquired  Will check labs today as this is the set after the last dose change.  Agree  with brand Synthroid but I do encouraged taking it in the morning on an empty stomach.  Keep the medicine at the bedside table.  We discussed Armour Thyroid but with her history of arrhythmias it may not be the best option.      Orders Placed This Encounter   Procedures   . TSH     Standing Status:   Future     Standing Expiration Date:   09/25/2022    . T4, free     Standing Status:   Future     Standing Expiration Date:   09/25/2022       Will contact the patient with results.  All questions answered. Patient expressed understanding and agreement. Total time spent  25 minutes included pre-chart review, counseling on the diagnosis, management, review of results and new testing orders. More than 50% of the time was spent counseling on the above issues. I appreciate the opportunity to participate in this patient's care.  Copy of this report will be sent to Dr. Hector Shade  Electronically signed by Cherly Beach, MD.  RTO prn    Please note: This note was transcribed using voice recognition software. Because of this technology there are often uinintended grammatical, spelling, and other transcription errors. Please disregard these errors.

## 2021-10-24 NOTE — Progress Notes (Signed)
 New Patient  New pt/ Shortness of breath/ referring Dr. Greggory Stallion [place]/medicare Allyne Gee from PCP called to make appt    Also being seen in sleep medicine for OSA

## 2021-11-05 NOTE — Progress Notes (Signed)
Discharge instructions:    -AVS given  -PFT scheduled   -Chest x ray- patient wants to get done today while shes here, from [place]  -pulm rehab. Wants to do at [facility], I will fax her information to them  -4 month follow up recall list made

## 2021-11-05 NOTE — Progress Notes (Signed)
Pulmonary & Critical Care Office Note      Assessment/Plan     Assessment/Plan     No problem-specific Assessment & Plan notes found for this encounter.    Problem List Items Addressed This Visit        Circulatory    Aortic stenosis    Relevant Orders    Complete PFT with and without bronchodilator    X-ray chest 2 views       Digestive    GERD without esophagitis - Primary    Relevant Medications    famotidine (PEPCID) 20 MG tablet       Respiratory    OSA (obstructive sleep apnea)    SOB (shortness of breath)    Relevant Orders    Complete PFT with and without bronchodilator    X-ray chest 2 views    Ambulatory referral to Pulmonary Rehabilitation       Other    History of COVID-19    Relevant Orders    Complete PFT with and without bronchodilator    X-ray chest 2 views    Ambulatory referral to Pulmonary Rehabilitation         Diagnoses and all orders for this visit:    GERD without esophagitis    History of COVID-19  -     Complete PFT with and without bronchodilator; Future  -     X-ray chest 2 views; Future  -     Ambulatory referral to Pulmonary Rehabilitation; Future    OSA (obstructive sleep apnea)    Nonrheumatic aortic valve stenosis  -     Complete PFT with and without bronchodilator; Future  -     X-ray chest 2 views; Future    SOB (shortness of breath)  -     Complete PFT with and without bronchodilator; Future  -     X-ray chest 2 views; Future  -     Ambulatory referral to Pulmonary Rehabilitation; Future    Other orders  -     famotidine (PEPCID) 20 MG tablet; Take 1 tablet (20 mg total) by mouth 2 (two) times a day .      Plan:    Given her history of COVID and need for Albuterol in the past, will check PFT for obstruction/restriction  Will check CXR for infiltrates and decide about HRCT based on above  Will hold off on bronchodilators for now until above and decide about LABA/ICS in future  Will add H2 blocker and avoid Caffeine. Antireflux measures were discussed  Plan for Pulmonary Rehab to  improve sob, exercise capacity and QOL  Sees Cardiology for Aortic stenosis  Maintain sleep hygiene  Plan for positional therapy with HOB elevation  Avoid sedatives, narcotics and respiratory suppressants as able  Avoid alcohol and smoking  Exercise and Weight loss will help  Avoid driving or operating heavy machinery when sleepy  Patient is currently using CPAP as prescribed, has been compliant and has shown improvement in symptoms. Appears to be benefiting from PAP therapy  Once again thanks for the kind consult! Please do not hesitate to call me with any questions.  Please note: This note was transcribed using voice recognition software. Because of this technology there are often uinintended grammatical, spelling, and other transcription errors. Please disregard these errors.    Orders Placed This Encounter   Procedures   . X-ray chest 2 views     Standing Status:   Future     Standing Expiration Date:  11/06/2022     Order Specific Question:   Reason for Exam:     Answer:   sob     Order Specific Question:   Radiologist clinically justified protocols will be used unless prohibited by ordering physician.     Answer:   Agree   . Ambulatory referral to Pulmonary Rehabilitation     Standing Status:   Future     Standing Expiration Date:   05/07/2022     Referral Priority:   Routine     Referral Type:   Consultation     Referral Reason:   Specialty Services Required     Requested Specialty:   Pulmonary Rehabilitation     Number of Visits Requested:   1   . Complete PFT with and without bronchodilator     Standing Status:   Future     Standing Expiration Date:   11/06/2022     Follow up: No follow-ups on file.    Immunization:   There is no immunization history on file for this patient.    Work up:   PFT: No results found for: "FEV1", "FVC", "FEV1FVC", "TLC", "DLCO", "FVCPOST", "FEV1POST", "FEV1FVCPOST"    : N    Nocturnal Pulse Oximetry: N    PSG: N    CPAP Titration: N    CT Chest: N    ECHO 03/20/21:   .   Technically difficult study.  IV echo contrast not performed due to inability to obtain IV access  .  The left ventricular systolic function is normal. The left ventricular ejection fraction is 70% (normal LVEF is 55%-74%).  .  Left ventricle is of normal size.  Marland Kitchen  Left ventricular diastolic dysfunction is present, consistent with impaired relaxation (Grade I).  .  There is normal left ventricular wall motion.  .  The right ventricle has normal size and systolic function.  .  Moderate aortic stenosis is present.  Mean gradient of 27 mm of Hg with aortic valve area of 1.2 centimeter sq and DI of 0.38  .  There is trace mitral regurgitation.  .  A pericardial effusion is not present.  .  Compared to the echo report of 02/15/2020, right ventricle is normal size now.        Subjective   Patient ID: Sheri Kemp is a 71 y.o. female  Referring physician: Wynetta Fines, MD  Chief Complaint:   HPI: Patient is a 71 y.o. female who has a known h/o OSA, Aortic Stenosis, HTN and GERD is here for further evaluation and management of SOB. Has been on Auto CPAP 5-15 for mild OSA.  Patient admits to having had COVID back in October of 2021.  Feels her symptoms started post COVID with some shortness of breath especially when walking stairs.  She notes occasional dry cough but has no wheezing.  Denies any chest pain or palpitations.  No h/o dysphagia or choking. No history of DVT or PE in the past but does of chronic left lower extremity swelling with lymphedema.  She had seasonal allergies in the past and exposure to cats at home needing albuterol for a month as prn. Has a Dog at home currently.  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   PAST MEDICAL HISTORY:   Past Medical History:   Diagnosis Date   . Aortic stenosis    . GERD (gastroesophageal reflux disease)    . Hyperlipidemia    . Hypertension    .  Hypothyroidism      PAST SURGICAL HISTORY:   Past Surgical History:   Procedure Laterality Date   .  BREAST BIOPSY     . CYSTOCELE REPAIR     . EYE SURGERY     . FOOT SURGERY     . KNEE ARTHROSCOPY Left    . REPAIR RECTOCELE       SOCIAL HISTORY:   Social History     Tobacco Use   . Smoking status: Never   . Smokeless tobacco: Never   Vaping Use   . Vaping Use: Never used   Substance Use Topics   . Alcohol use: Yes     Comment: occasional   . Drug use: Never     FAMILY HISTORY:   Family History   Problem Relation Age of Onset   . Cancer Mother    . Alzheimer's disease Father      ALLERGIES:  Allergies   Allergen Reactions   . Fentanyl    . Penicillins      MEDS:   Current Outpatient Medications   Medication Sig   . cholecalciferol, vitamin D3, 10 mcg (400 unit) tab Take 2.5 tablets (1,000 Units total) by mouth daily .   . docusate (COLACE) 100 mg/10 mL (10 mg/mL) liquid Take 1 tablet by mouth daily.   Marland Kitchen estrogens, conjugated (PREMARIN VAGL) Use as directed   . levothyroxine (SYNTHROID) 137 MCG tablet Take 1 tablet (137 mcg total) by mouth every morning before breakfast . 6.5 tabs a week   . magnesium oxide (MAG-OX) 400 mg (241.3 mg magnesium) tablet Take 1 tablet (400 mg total) by mouth daily .   . metoprolol succinate ER (TOPROL-XL) 25 MG tablet Take 1 tablet (25 mg total) by mouth 2 (two) times a day.   Marland Kitchen atorvastatin (LIPITOR) 20 MG tablet Take 1 tablet (20 mg total) by mouth daily .   . famotidine (PEPCID) 20 MG tablet Take 1 tablet (20 mg total) by mouth 2 (two) times a day .     No current facility-administered medications for this visit.     Review of Systems:-ve other than specified above.            Objective       Vitals:  BP: (!) 140/96, Heart Rate: 79, Resp: 20, Temp: 97.6 F (36.4 C), Temp Source: Forehead, SpO2: 98 %,  , Weight: (!) 101.6 kg (224 lb)  Ideal Body Weight: Ideal body weight: 59.3 kg (130 lb 11.7 oz)  Adjusted ideal body weight: 76.2 kg (168 lb 0.6 oz)    BMI: body mass index is 36.15 kg/m.    ---------------  Physical Exam:    Constitutional:  Blood pressure (!) 140/96, pulse 79,  temperature 97.6 F (36.4 C), temperature source Forehead, resp. rate 20, weight (!) 101.6 kg (224 lb), SpO2 98 %.  Eyes: Eye Lids and Conjunctiva Normal.   No pallor/icterus  Neck: Supple, No thyromegaly. No cervical lymphadenopathy.  Cardio:  Normal First and Second Heart sounds, No gallops  Respiratory:  Good air entry with no rhonchi. No rales  GI:  Abdomen is soft, non tender  Skin:  Warm with no rash. No mottling noted  Neuro: Moves extremities and follows commands  Musculoskeletal: Good range of motion. No tenderness.   Extremities: + edema, No calf tenderness  Psych: Alert, awake and oriented.       Labs     CBC:   Lab Results   Component Value Date  WBC 6.80 07/29/2020    HGB 14.1 07/29/2020    HCT 40.7 07/29/2020    PLT 238 07/29/2020     BMP:     Chemistry        Component Value Date/Time    NA 139 08/22/2020 1446    K 3.9 08/22/2020 1446    CL 99 08/22/2020 1446    CO2 25 08/22/2020 1446    BUN 13 08/22/2020 1446    CREATININE 0.95 (H) 08/22/2020 1446    GLU 77 08/22/2020 1446        Component Value Date/Time    CALCIUM 9.6 08/22/2020 1446    ALKPHOS 95 07/29/2020 0000    AST 18 07/29/2020 0000    AST 18 07/28/2020 0000    ALT 30 07/29/2020 0000    ALT 31 07/28/2020 0000    BILITOT 0.5 07/29/2020 0000          LFTs:   Lab Results   Component Value Date    ALT 30 07/29/2020    AST 18 07/29/2020    ALKPHOS 95 07/29/2020    BILITOT 0.5 07/29/2020    PROT 7.3 07/29/2020    ALBUMIN 3.9 07/29/2020     Coags: No results found for: "PT", "INR", "PTT"  Micro:  Microbiology Results     None        SARS-CoV-2 NAA, FluA/B, RSV and/or the Respiratory Viral Panel 2:      ECHO:No results found for this or any previous visit.      Imaging     Reviewed imaging studies personally and findings as below  No results found.    MMRC Dyspnea Scale:    Cough/Secretions:      COPD ASSESSMENT - LAST VALUE                      COPD Combined Assessment:      COPD Assessment Tool:    GOLD Stage:     Combined Assessment Stage:

## 2021-11-05 NOTE — Progress Notes (Signed)
PFT Completed in clinic today. 3 puffs albuterol given via spacer. Patient tolerated well.

## 2021-11-06 ENCOUNTER — Other Ambulatory Visit: Payer: Self-pay

## 2021-11-06 ENCOUNTER — Other Ambulatory Visit: Payer: Medicare Other | Attending: PHYSICIAN ASSISTANT

## 2021-11-06 DIAGNOSIS — E039 Hypothyroidism, unspecified: Secondary | ICD-10-CM | POA: Insufficient documentation

## 2021-11-07 LAB — THYROXINE, FREE (FREE T4): THYROXINE (T4), FREE: 0.99 ng/dL (ref 0.78–2.10)

## 2021-11-07 LAB — THYROID STIMULATING HORMONE (SENSITIVE TSH): TSH: 10.1 u[IU]/mL — ABNORMAL HIGH (ref 0.465–4.680)

## 2021-11-12 ENCOUNTER — Ambulatory Visit: Payer: Medicare Other | Attending: PHYSICIAN ASSISTANT | Admitting: PHYSICIAN ASSISTANT

## 2021-11-12 ENCOUNTER — Encounter (INDEPENDENT_AMBULATORY_CARE_PROVIDER_SITE_OTHER): Payer: Self-pay | Admitting: PHYSICIAN ASSISTANT

## 2021-11-12 ENCOUNTER — Other Ambulatory Visit: Payer: Self-pay

## 2021-11-12 VITALS — BP 124/78 | HR 74 | Temp 97.3°F | Ht 66.0 in | Wt 224.0 lb

## 2021-11-12 DIAGNOSIS — E039 Hypothyroidism, unspecified: Secondary | ICD-10-CM

## 2021-11-12 NOTE — Patient Instructions (Signed)
Continue Synthroid 137 mcg daily DAW at night time   Give more time on 137 for about 4 weeks and then reassess  TSH, Free T4 end November and discuss results by phone  TSH, Free T4 end April

## 2021-11-12 NOTE — Progress Notes (Signed)
[facility]  [address]  [place] [address]  Operated by [facility]  Progress Note    Name: Nida Manfredi MRN:  [id number]   Date: 11/12/2021 Age: 71 y.o.       Subjective:     Patient ID:   Mannie Wineland is an 71 y.o. female.      Chief Complaint:      Chief Complaint   Patient presents with    Hypothyroidism     F/u     Saw Dr. Janese Banks at [facility] for second opinion.  She agreed to brand synthroid.  She suggested that medication be moved back to morning  TSH was drawn there on 09/25/21 and was 55.  Was recommended to increase back to 137 mcg daily    Moved thyroid back to night after a few days  Benefiber doing in the morning    TSH- 2.180 to 5.380 to 0.301 and 0.222 to 10.40 to 55 ([facility]) to 10.10  Free T4- 1.35 to 1.09 to 0.82 to 1.45 to 1.09 to 0.99    Weight loss is stable    Does feel better on Synthroid brand    Feels better. More lifted.  Not worn out in the afternoon as with generics.    Weight is the same.    Review of Systems   Constitutional:  Negative for malaise/fatigue and weight loss.   Cardiovascular:  Negative for chest pain and palpitations.   Endo/Heme/Allergies:         Heat intolerance is better       Objective:   Physical Exam  Neck:      Thyroid: Thyroid normal.      Vascular: No carotid bruit.   Cardiovascular:      Rate and Rhythm: Regular rhythm.   Musculoskeletal:      Cervical back: Neck supple.   Lymphadenopathy:   No no anterior cervical adenopathy or no posterior cervical adenopathy.    Upper Body: No supraclavicular adenopathy is present.     Pulm:    Lungs are clear to auscultation.    Thyroid:  Thyroid normal.                Assessment & Plan:       ICD-10-CM    1. Hypothyroidism, adult  E03.9         Plan:   Continue Synthroid 137 mcg daily DAW at night time   Give more time on 137 for about 4 weeks and then reassess  TSH, Free T4 end November and discuss results by phone  TSH, Free T4 end April      Maleeha Halls, [place]

## 2021-11-13 ENCOUNTER — Telehealth (INDEPENDENT_AMBULATORY_CARE_PROVIDER_SITE_OTHER): Payer: Self-pay | Admitting: PHYSICIAN ASSISTANT

## 2021-11-13 MED ORDER — SYNTHROID 137 MCG TABLET
137.0000 ug | ORAL_TABLET | Freq: Every morning | ORAL | 5 refills | Status: DC
Start: 2021-11-13 — End: 2021-12-12

## 2021-11-13 NOTE — Telephone Encounter (Signed)
-----   Message from [place]. Ullmer sent at 11/12/2021  4:56 PM EDT -----  Regarding: Synthroid 137 mcg daily  Contact: [phone]  Dear Warren Lacy, I need my script corrected to 137 mcg   My pharmacy needs new Rx  They still have one daily , on 7th day half tab.   Called office gone for day  Thanks   Truman Hayward [phone]

## 2021-12-11 ENCOUNTER — Encounter (INDEPENDENT_AMBULATORY_CARE_PROVIDER_SITE_OTHER): Payer: Self-pay | Admitting: PHYSICIAN ASSISTANT

## 2021-12-12 ENCOUNTER — Other Ambulatory Visit (INDEPENDENT_AMBULATORY_CARE_PROVIDER_SITE_OTHER): Payer: Self-pay | Admitting: PHYSICIAN ASSISTANT

## 2021-12-12 DIAGNOSIS — E039 Hypothyroidism, unspecified: Secondary | ICD-10-CM

## 2021-12-12 MED ORDER — SYNTHROID 150 MCG TABLET
150.0000 ug | ORAL_TABLET | Freq: Every morning | ORAL | 3 refills | Status: DC
Start: 2021-12-12 — End: 2022-05-16

## 2022-02-15 ENCOUNTER — Ambulatory Visit (HOSPITAL_COMMUNITY): Payer: Self-pay | Admitting: PHYSICIAN ASSISTANT

## 2022-02-18 ENCOUNTER — Encounter (INDEPENDENT_AMBULATORY_CARE_PROVIDER_SITE_OTHER): Payer: Self-pay | Admitting: PHYSICIAN ASSISTANT

## 2022-02-18 ENCOUNTER — Telehealth (INDEPENDENT_AMBULATORY_CARE_PROVIDER_SITE_OTHER): Payer: Self-pay | Admitting: PHYSICIAN ASSISTANT

## 2022-02-18 DIAGNOSIS — E039 Hypothyroidism, unspecified: Secondary | ICD-10-CM

## 2022-02-18 NOTE — Patient Instructions (Signed)
Labs reviewed and messaged patient to reduce to 150 6 days/week and 1/2 tablet on Sundays.  Labs in 6 weeks to be mailed

## 2022-02-18 NOTE — Telephone Encounter (Signed)
Mailed lab order

## 2022-02-26 NOTE — Telephone Encounter (Addendum)
-----   Message from [place] L. Nevitt sent at 02/25/2022  4:49 PM EST -----  Regarding: VT seen on heart monitor while walking on treadmill today  Contact: [phone]  Dear Dr Nevin Bloodgood, I have had my results of my ct scan of my heart since Thursday , after they were done.   I thought I would hear from you today. A lot of info in that report overwhelming to say the least.   I do not see you till 03/20/22    So hoping to get a call regarding the ct heart results.   Thank you Sheri Kemp    CTA shows nonobstructive CAD. Pt is on aspirin 81 mg and Toprol 25 mg. She reports she stopped the atorvastatin 20 mg two years ago because she was having side effects. Med list changed.    Discussed results with pt. She remains dyspneic with activity. Discussed with her that at this time, I don't think she has any hemodynamically significant stenoses causing this; we did discuss potential for microvascular disease. I would like to get Holter back prior to making any changes to antianginal medications.     We briefly discussed indication for lipid therapy today. She would like to wait to discuss with Dr Donny Pique further at her upcoming appointment.    [facility], [place]

## 2022-03-20 NOTE — Progress Notes (Signed)
Reason For Visit:  Cardiac follow-up for aortic stenosis      History of Present Illness:      Ms. Sheri Kemp is a 72 year old female, followed by Dr. Drexel Iha, with obesity/obstructive sleep apnea compliant with CPAP, compliant with CPAP, hypertension, hyperlipidemia, hypothyroidism and chronic bilateral lower extremity edema and moderate aortic stenosis.  cardiac MRI 04/21/2020 which showed normal biventricular function with EF of 60%, normal RV size, moderate aortic stenosis with aortic valve area of 1.1 centimeters sq but cannot rule out bicuspid aortic valve.  Echocardiogram 3/23 shows normal biventricular function with EF of 70%, moderate aortic stenosis with mean gradient of 27, aortic valve area of 1.2 and trace MR.    Could not tolerate Cardizem CD.  PFTs 10/23 showed some air trapping but no obstruction.  Reports of having myalgias with Lipitor    Since the last office visit, had CT angiogram of coronaries for continued evaluation of shortness of breath 02/12/2022 which showed moderate stenosis in the RCA with no flow limitation and mild left main and LAD disease.  Had Holter monitor 02/21/2022 for palpitations which showed normal sinus rhythm with heart rate of 71 beats per minute and 93 episodes of SVT, longest being 11 seconds with 90% frequent PACs.    She went through pulmonary rehab and reports of somewhat improved shortness of breath but still continues to have dyspnea on exertion with the exercise and activity though not exercising much yet.  Reports that Dr. Maryruth Eve recommended chronic inhaler therapy with steroid which she did not want to start yet and also not using the p.r.n. albuterol inhaler at this point.   Admits to chronic lower extremity edema with no recent worsening.Denies any chest pain, PND, orthopnea, lightheadedness, dizziness, palpitations, syncope or claudication symptoms.  Denies any smoking.  Admits to being compliant with CPAP machine    Past medical  history:  Hypertension  Hyperlipidemia  Obesity  Hypothyroid  Chronic bilateral lower extremity edema     Past Surgical History:   Procedure Laterality Date   . BREAST BIOPSY     . CYSTOCELE REPAIR     . EYE SURGERY     . FOOT SURGERY     . KNEE ARTHROSCOPY Left    . REPAIR RECTOCELE         Family History   Problem Relation Age of Onset   . Cancer Mother    . Alzheimer's disease Father        Social History     Socioeconomic History   . Marital status: Married     Spouse name: Not on file   . Number of children: Not on file   . Years of education: Not on file   . Highest education level: Not on file   Occupational History   . Not on file   Tobacco Use   . Smoking status: Never   . Smokeless tobacco: Never   Vaping Use   . Vaping Use: Never used   Substance and Sexual Activity   . Alcohol use: Yes     Comment: occasional   . Drug use: Never   . Sexual activity: Not on file   Other Topics Concern   . Not on file   Social History Narrative   . Not on file     Social Determinants of Health     Financial Resource Strain: Not on file   Food Insecurity: Not on file   Stress: Not on file   Social Connections:  Not on file       ALLERGIES:    Allergies   Allergen Reactions   . Fentanyl    . Penicillins         I have comprehensively reviewed the records of their past medical history, family history, social history and changes from last office visit      Current Outpatient Medications   Medication Sig Dispense Refill   . aspirin 81 mg chewable tablet Chew and Swallow 1 tablet (81 mg total) by mouth daily . 30 tablet 11   . docusate (COLACE) 100 mg/10 mL (10 mg/mL) liquid Take 1 tablet by mouth daily.     Marland Kitchen estrogens, conjugated (PREMARIN VAGL) Use as directed     . famotidine (PEPCID) 20 MG tablet Take 1 tablet (20 mg total) by mouth 2 (two) times a day . 60 tablet 11   . levalbuterol (XOPENEX HFA) 45 mcg/actuation inhaler Inhale 1-2 puffs every 4 (four) hours as needed for wheezing . 15 g 12   . magnesium oxide (MAG-OX) 400  mg (241.3 mg magnesium) tablet Take 1 tablet (400 mg total) by mouth daily .     . metoprolol succinate ER (TOPROL-XL) 25 MG tablet Take 1 tablet (25 mg total) by mouth 2 (two) times a day. 180 tablet 3   . RED YEAST RICE ORAL Take by mouth .     Marland Kitchen Synthroid 150 mcg tablet        No current facility-administered medications for this visit.             Review of Systems  Review of systems are as per HPI    VITAL SIGNS:  Visit Vitals    03/20/22 1057   BP: (!) 148/80   Pulse: 90   Patient Position: Sitting   Height: 5\' 7"  (1.702 m)   Weight: (!) 102.5 kg (226 lb)   SpO2: 97%   BMI (Calculated): 35.39         Physical Exam  Not in acute distress.  Awake, alert and oriented x3 Head-atraumatic and normocephalic.  Anicteric.  Extraocular muscles are intact.  Mucous membranes are moist.  Jugular venous pulses are within normal limits.  Neck-supple with no lymphadenopathy.  Carotids are normal bilaterally without any carotid bruits.  Lungs are clear to auscultation bilaterally without any wheezes rhonchi or crackles.  Cardiac exam has normally heard S1 and decreased S2, regular with systolic murmur with rubs or gallops.  Abdomen is soft, nontender, nondistended, no organomegaly with normal bowel sounds.  Extremities have no cyanosis or clubbing bilaterally.  Have bilateral chronic lower extremity edema with varicose veins.  Has palpable distal peripheral pulses.  Skin-has no rash.  No focal neurological deficits.  Musculoskeletal-normal muscle strength and tone with normal range of motion.  Psychologic-pleasant and calm      LABORATORY     Blood work 03/18/2022 shows creatinine of 0.82, potassium of 4.3 normal AST and ALT cholesterol of 210, triglycerides of 149, HDL of 42 and LDL of 139    Personally reviewed the echocardiogram images performed in the office today which showed normal biventricular function with EF of 65-70%, severely sclerotic aortic valve with mean gradient of 30 mm of Hg, aortic valve area of 1.2 cm,  dimensionless index of 0.43 with stroke volume index of 45 suggesting moderate aortic stenosis with trace MR and TR.  Diagnosis    ICD-9-CM ICD-10-CM    1. Essential hypertension  401.9 I10 Basic Metabolic Panel  Lipid panel w/Reflex to Direct LDL      Hepatic Function Panel      2. Dyslipidemia  272.4 E78.5 Basic Metabolic Panel      Lipid panel w/Reflex to Direct LDL      Hepatic Function Panel      3. Coronary artery disease involving native coronary artery of native heart without angina pectoris  414.01 I25.10 Basic Metabolic Panel      Lipid panel w/Reflex to Direct LDL      Hepatic Function Panel      4. Nonrheumatic aortic valve stenosis  424.1 I35.0 Basic Metabolic Panel      Lipid panel w/Reflex to Direct LDL      Hepatic Function Panel      5. OSA (obstructive sleep apnea)  327.23 G47.33 Basic Metabolic Panel      Lipid panel w/Reflex to Direct LDL      Hepatic Function Panel      6. Obesity (BMI 30-39.9)  278.00 E66.9 Basic Metabolic Panel      Lipid panel w/Reflex to Direct LDL      Hepatic Function Panel      7. Nonrheumatic mitral valve regurgitation  424.0 I34.0 Basic Metabolic Panel      Lipid panel w/Reflex to Direct LDL      Hepatic Function Panel      8. Nonrheumatic tricuspid valve regurgitation  424.2 I36.1 Basic Metabolic Panel      Lipid panel w/Reflex to Direct LDL      Hepatic Function Panel      9. DOE (dyspnea on exertion)  786.09 R06.09 Basic Metabolic Panel      Lipid panel w/Reflex to Direct LDL      Hepatic Function Panel                  Plan:     1. Dyspnea on exertion-not clear the etiology of her dyspnea on exertion, probably from deconditioning/obesity versus pulmonary hypertension versus lung disease.  At this point based upon CT angiogram and echocardiogram I do not see any explanation for her dyspnea on exertion from cardiac standpoint.  Discussed with her about either increased exercise activity weight loss and reassessing the symptoms are considering right and left heart  catheterization to rule out any obstructive significant CAD/pulmonary hypertension though less likely based upon the echo and CT angiogram of coronaries.  She at this point would like to try increasing exercise activity and reassess the symptoms.  Of for her about cardiac rehab, she would like to hold off on at this point.  Asked her to start exercising on a regular basis, weight loss and call us back in 3 months regarding her symptoms of dyspnea on exertion.  If no improvement in symptoms, would consider right and left heart catheterization.      2. Moderate CAD-she does have evidence of moderate RCA stenosis on the CT angiogram.  Continue on aspirin.  Discussed with her about keeping LDL less than 70 and initiation of statin.  She at this point would like to think about it and let us know.  Explained about trying Crestor as she has myalgias with Lipitor in the past.    3.  moderate aortic stenosis/trace MR /TR/chronic bilateral lower extremity edema-her aortic stenosis is moderate on the repeat echocardiogram .  No evidence of decompensated heart failure on clinical examination.  Her lower extremity edema is from venous incompetence.   Unable to exclude bicuspid aortic valve even by cardiac  MRI.    4. Hypertension/LVH-her blood pressure is slightly elevated in the office even on repeat check.  Taken the liberty of increasing Toprol-XL to 50 mg p.o. b.i.d..   Discussed about dietary modification of salt intake.    5. Hyperlipidemia-her recent lipid profile shows LDL levels greater than 70.  Would like to keep her LDL less than 70 given CAD.  Had a long discussion with her about consideration of statin which she is hesitant at this point.  Discussed about consideration of Crestor or PCSK9 inhibitors  .  She at this point would like to think about it and let us know.  Had myalgias with Lipitor.  Encouraged about increased exercise activity and weight loss to help in improved cholesterol levels.  Check lipid panel  with LFTs 6 months    4.  obesity/obstructive sleep apnea -she admits to compliant with CPAP machine.  Right ventricle is normal size on the echocardiogram.  Encouraged about increased exercise activity and dietary modification to assist in weight loss.    5. SVT/frequent PACs-she reports of having still mild palpitations at night.  Would continue with the Toprol-XL 25 b.i.d. as her symptoms are mostly at night..    6.  Follow up-see her in 6 months        Sampson Goon, MD      Please note: This note was transcribed using voice recognition software. Because of this technology there are often uinintended grammatical, spelling, and other transcription errors. Please disregard these errors.

## 2022-03-26 NOTE — Patient Instructions (Signed)

## 2022-04-08 ENCOUNTER — Encounter (INDEPENDENT_AMBULATORY_CARE_PROVIDER_SITE_OTHER): Payer: Self-pay | Admitting: PHYSICIAN ASSISTANT

## 2022-05-16 ENCOUNTER — Ambulatory Visit: Payer: Medicare Other | Attending: PHYSICIAN ASSISTANT | Admitting: PHYSICIAN ASSISTANT

## 2022-05-16 ENCOUNTER — Other Ambulatory Visit: Payer: Self-pay

## 2022-05-16 ENCOUNTER — Encounter (INDEPENDENT_AMBULATORY_CARE_PROVIDER_SITE_OTHER): Payer: Self-pay | Admitting: PHYSICIAN ASSISTANT

## 2022-05-16 VITALS — BP 118/80 | HR 60 | Temp 98.2°F | Ht 66.0 in | Wt 229.0 lb

## 2022-05-16 DIAGNOSIS — E039 Hypothyroidism, unspecified: Secondary | ICD-10-CM

## 2022-05-16 NOTE — Patient Instructions (Addendum)
Plan:  Decrease Synthroid 150 mcg 6 days/week and skip on Sundays  TSH, Free T4 early July and results by phone  TSH, Free T4 early September

## 2022-05-16 NOTE — Progress Notes (Signed)
ENDOCRINOLOGY, [facility]  [address]  [place] [address]  Operated by [facility]  Progress Note    Name: Latisia Wrzesinski MRN:  [id number]   Date: 05/16/2022 Age: 72 y.o.       Subjective:     Patient ID:   Sheri Kemp is an 72 y.o. female.      Chief Complaint:      Chief Complaint   Patient presents with    Hypothyroidism     F/u     Had episode of questionable TIA.  Went to [facility]  Kept overnight.  All labs were negative.  She had cardiac studies and all negative.  They did add ASA 81 mg daily.  Pressure was elevated so she increased Toprol 50 mg twice/daily  Shortness of Breath is now gone    Did go through pulmonary rehab and it did seem to help  She saw a neurologist and felt was stable by Dr. Tresa Endo    TSH- 2.180 to 5.380 to 0.301 and 0.222 to 10.40 to 55 ([facility]) to 10.10 to 0.128 to 0.016  Free T4- 1.35 to 1.09 to 0.82 to 1.45 to 1.09 to 0.99 to 1.04 to 1.59        Review of Systems   Constitutional:  Negative for malaise/fatigue and weight loss.   Cardiovascular:  Negative for chest pain and palpitations.   Endo/Heme/Allergies:         Heat intolerance is better       Objective:   Physical Exam  Neck:      Thyroid: Thyroid normal.      Vascular: No carotid bruit.   Cardiovascular:      Rate and Rhythm: Regular rhythm.   Musculoskeletal:      Cervical back: Neck supple.   Lymphadenopathy:   No no anterior cervical adenopathy or no posterior cervical adenopathy.    Upper Body: No supraclavicular adenopathy is present.     Pulm:    Lungs are clear to auscultation.    Thyroid:  Thyroid normal.                Assessment & Plan:       ICD-10-CM    1. Hypothyroidism, adult  E03.9         Plan:  Decrease Synthroid 150 mcg 6 days/week and skip on Sundays  TSH, Free T4 early July and results by phone  TSH, Free T4 early September      Alexis Reber, [place]

## 2022-06-26 IMAGING — MG MAMMOGRAPHY SCREENING BILATERAL 3[PERSON_NAME]
8 series · 8 of 24 positions shown · non-contrast
Comparison: Comparison was made to prior examinations.

________________________________________________________________________________________________ 
MAMMOGRAPHY SCREENING BILATERAL 3ALIMA HOU, 06/26/2022 [DATE]: 
CLINICAL INDICATION: Encounter for screening mammogram.
TECHNIQUE: Digital bilateral mammograms and 3-D Tomosynthesis were obtained. 
These were interpreted both primarily and with the aid of computer-aided 
detection system.  
BREAST DENSITY: (Level B) There are scattered areas of fibroglandular density.

[R MLO]
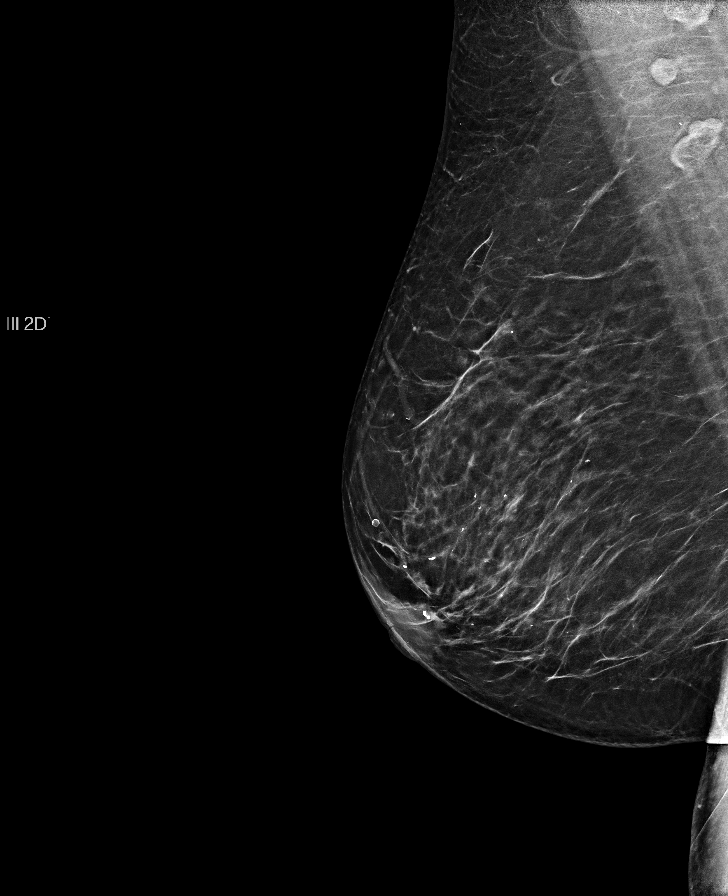

[R CC]
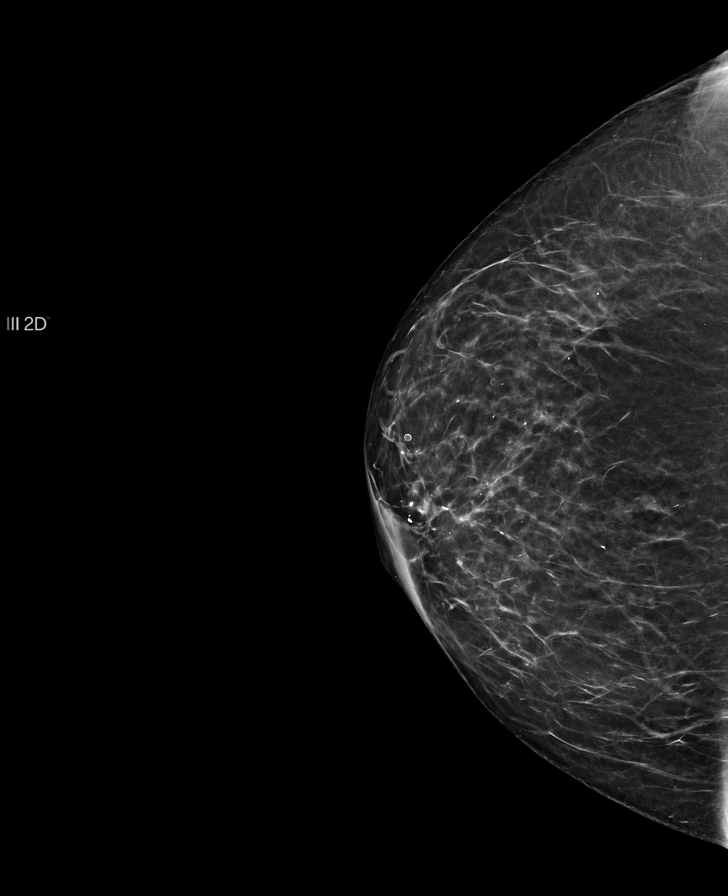

[L MLO]
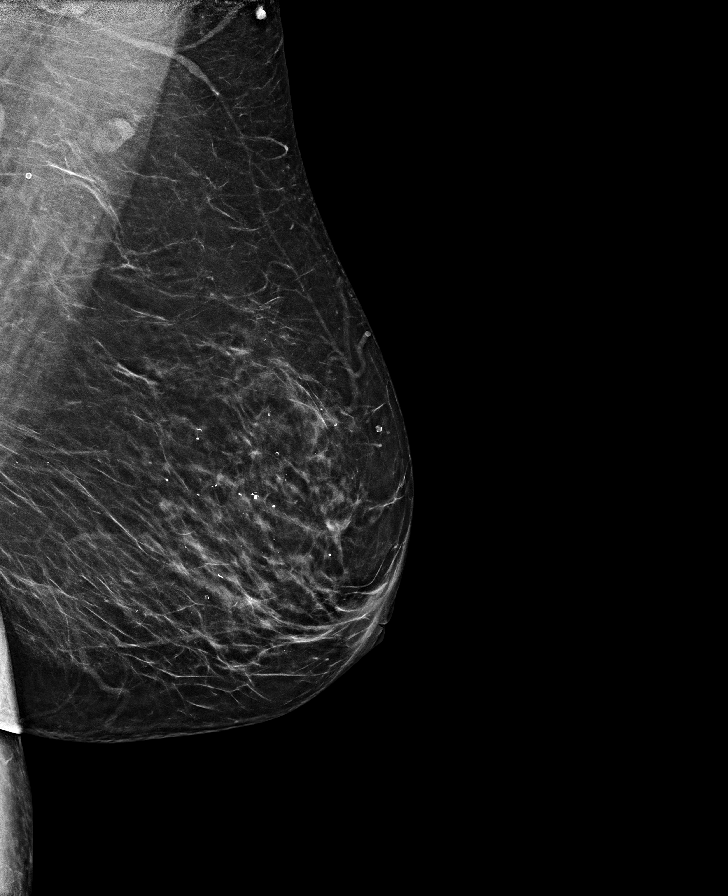

[L CC]
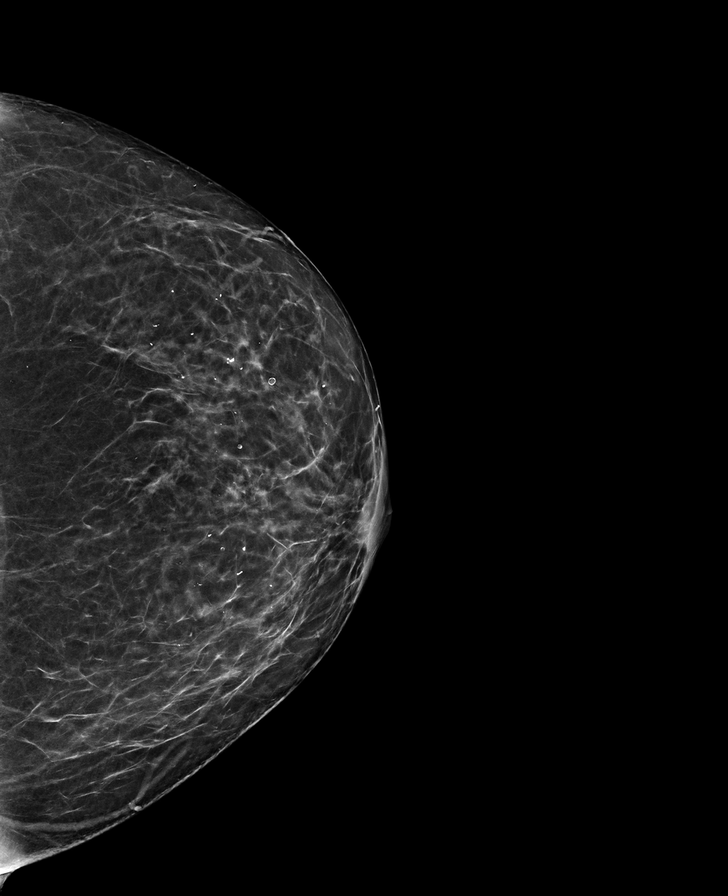

[R CC tomo · tomo slice 33/66.0]
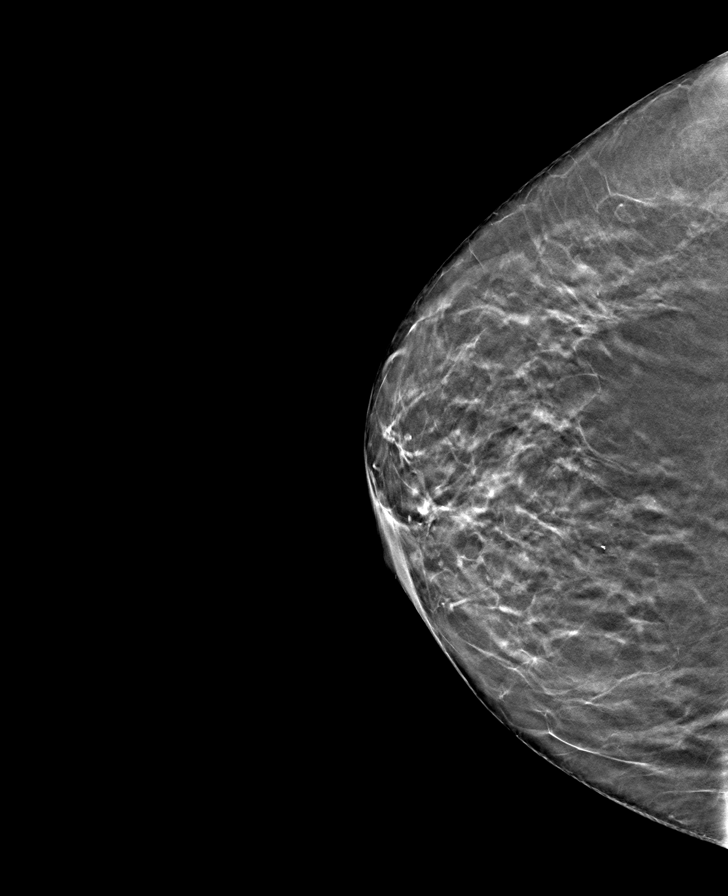

[R MLO tomo · tomo slice 38/75.0]
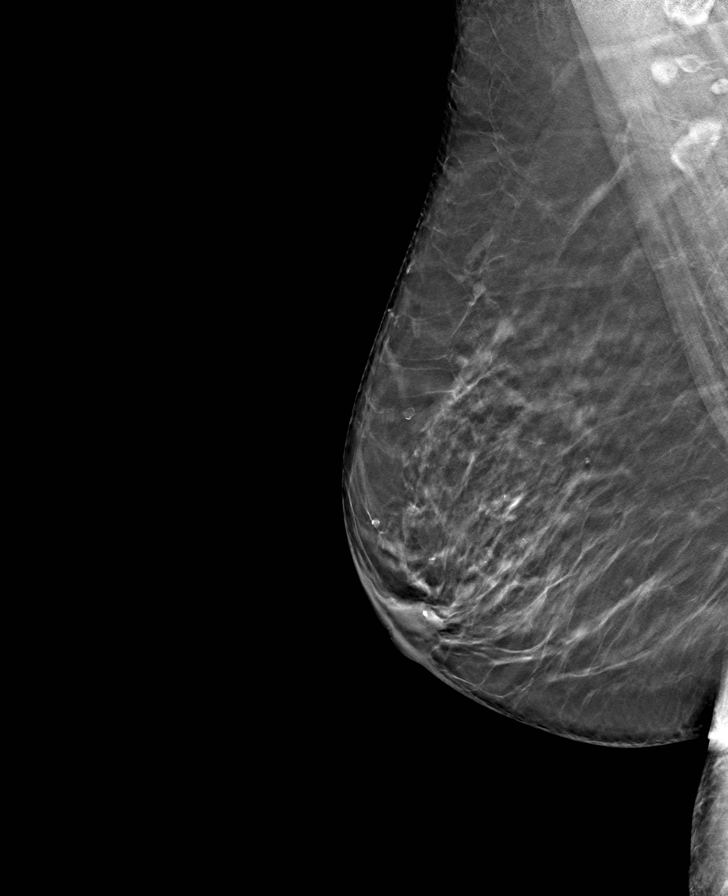

[L CC tomo · tomo slice 32/63.0]
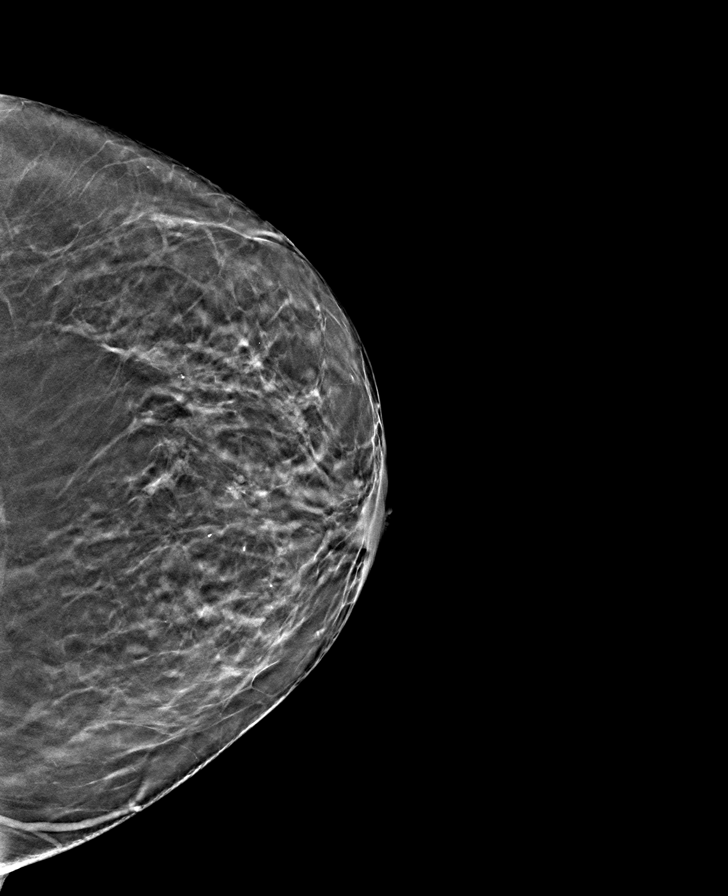

[L MLO tomo · tomo slice 38/75.0]
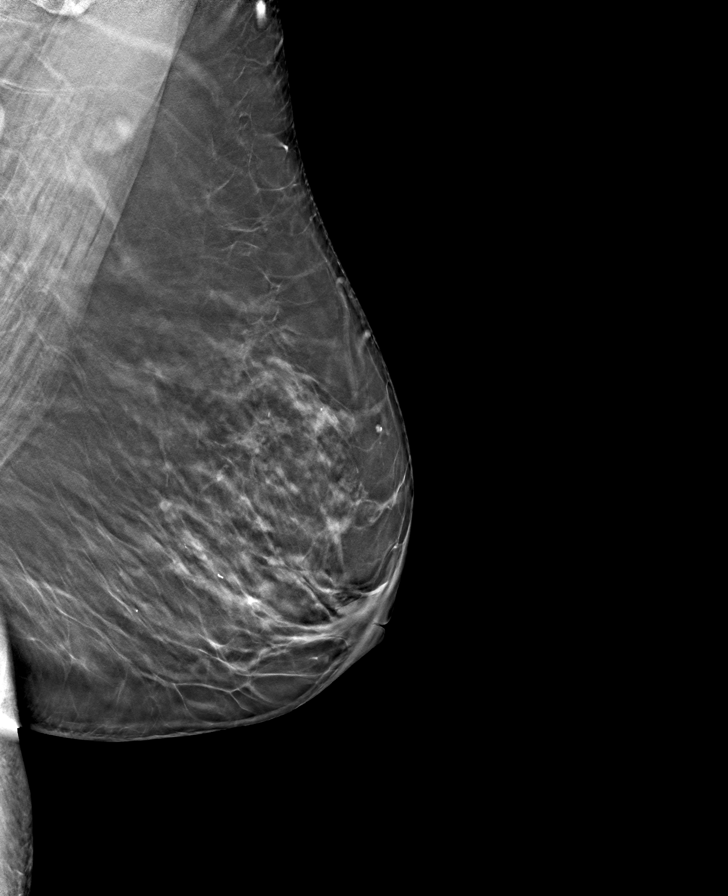

[8 of 24 positions shown; findings below may reference images not displayed]

FINDINGS: Benign calcification. No suspicious mass, calcifications, or area of 
architectural distortion in either breast.
IMPRESSION: Stable mammogram. 
(BI-RADS 2) Benign findings. Routine mammographic follow-up is recommended.

## 2022-07-17 ENCOUNTER — Telehealth (INDEPENDENT_AMBULATORY_CARE_PROVIDER_SITE_OTHER): Payer: Self-pay | Admitting: PHYSICIAN ASSISTANT

## 2022-07-17 ENCOUNTER — Encounter (INDEPENDENT_AMBULATORY_CARE_PROVIDER_SITE_OTHER): Payer: Self-pay | Admitting: PHYSICIAN ASSISTANT

## 2022-07-17 MED ORDER — SYNTHROID 125 MCG TABLET
125.0000 ug | ORAL_TABLET | Freq: Every morning | ORAL | 5 refills | Status: DC
Start: 2022-07-17 — End: 2022-07-17

## 2022-07-17 MED ORDER — SYNTHROID 125 MCG TABLET
125.0000 ug | ORAL_TABLET | Freq: Every morning | ORAL | 3 refills | Status: DC
Start: 2022-07-17 — End: 2022-10-28

## 2022-07-17 NOTE — Telephone Encounter (Signed)
Patient informed. 

## 2022-07-17 NOTE — Addendum Note (Signed)
Addended by: Ericka Pontiff on: 07/17/2022 08:59 AM     Modules accepted: Orders

## 2022-07-17 NOTE — Patient Instructions (Signed)
Labs reviewed.  Thyroid not much better.  Reduce to 125 mcg 7 days/week.  Needs to be brand Synthroid

## 2022-07-17 NOTE — Addendum Note (Signed)
Addended by: Ericka Pontiff on: 07/17/2022 01:23 PM     Modules accepted: Orders

## 2022-09-26 ENCOUNTER — Ambulatory Visit (INDEPENDENT_AMBULATORY_CARE_PROVIDER_SITE_OTHER): Payer: Self-pay | Admitting: PHYSICIAN ASSISTANT

## 2022-10-21 ENCOUNTER — Other Ambulatory Visit (INDEPENDENT_AMBULATORY_CARE_PROVIDER_SITE_OTHER): Payer: Self-pay

## 2022-10-25 ENCOUNTER — Encounter (INDEPENDENT_AMBULATORY_CARE_PROVIDER_SITE_OTHER): Payer: Self-pay | Admitting: PHYSICIAN ASSISTANT

## 2022-10-28 ENCOUNTER — Other Ambulatory Visit: Payer: Self-pay

## 2022-10-28 ENCOUNTER — Encounter (INDEPENDENT_AMBULATORY_CARE_PROVIDER_SITE_OTHER): Payer: Self-pay | Admitting: PHYSICIAN ASSISTANT

## 2022-10-28 ENCOUNTER — Ambulatory Visit: Payer: Medicare Other | Attending: PHYSICIAN ASSISTANT | Admitting: PHYSICIAN ASSISTANT

## 2022-10-28 VITALS — BP 150/90 | HR 71 | Temp 93.0°F | Wt 224.0 lb

## 2022-10-28 DIAGNOSIS — E039 Hypothyroidism, unspecified: Secondary | ICD-10-CM | POA: Insufficient documentation

## 2022-10-28 NOTE — Progress Notes (Signed)
ENDOCRINOLOGY, [facility]  [address]  [place] [address]  Operated by [facility]  Progress Note    Name: Sheri Kemp MRN:  [id number]   Date: 10/28/2022 Age: 72 y.o.       Subjective:     Patient ID:   Sheri Kemp is an 72 y.o. female.      Chief Complaint:      Chief Complaint   Patient presents with    Hypothyroidism     F/u     Eating one meal/day.  Trying to stay away from bread    She had cardiac studies and all negative.  Shortness of breath is gone since increase in Toprol XL  Still taking Baby ASA    She saw a neurologist and felt was stable by Dr. Tresa Endo    TSH- 2.180 to 5.380 to 0.301 and 0.222 to 10.40 to 55 ([facility]) to 10.10 to 0.128 to 0.016 to 0.010 to 0.013  Free T4- 1.35 to 1.09 to 0.82 to 1.45 to 1.09 to 0.99 to 1.04 to 1.59 to 1.54 to 1.83        Review of Systems   Constitutional:  Negative for malaise/fatigue and weight loss.   Cardiovascular:  Negative for chest pain and palpitations.   Endo/Heme/Allergies:         Heat intolerance is better       Objective:   Physical Exam  Neck:      Thyroid: Thyroid normal.      Vascular: No carotid bruit.   Cardiovascular:      Rate and Rhythm: Regular rhythm.   Musculoskeletal:      Cervical back: Neck supple.   Lymphadenopathy:   No no anterior cervical adenopathy or no posterior cervical adenopathy.    Upper Body: No supraclavicular adenopathy is present.     Pulm:    Lungs are clear to auscultation.    Thyroid:  Thyroid normal.                Assessment & Plan:       ICD-10-CM    1. Hypothyroidism, adult  E03.9         Plan:  Decrease Synthroid 125 mcg daily 6 days/week and 1/2 tablet 7th day DAW  If TSH does not normalize then reduce to 112 mcg daily DAW 7 days/week  TSH, Free T4 middle December and results by phone  TSH, Free T4 middle April      Johnn Krasowski, [place]

## 2022-10-28 NOTE — Patient Instructions (Signed)
Plan:  Decrease Synthroid 125 mcg daily 6 days/week and 1/2 tablet 7th day DAW  If TSH does not normalize then reduce to 112 mcg daily DAW 7 days/week  TSH, Free T4 middle December and results by phone  TSH, Free T4 middle April

## 2023-01-17 ENCOUNTER — Telehealth (INDEPENDENT_AMBULATORY_CARE_PROVIDER_SITE_OTHER): Payer: Self-pay | Admitting: PHYSICIAN ASSISTANT

## 2023-01-17 NOTE — Telephone Encounter (Signed)
Patient informed. 

## 2023-01-17 NOTE — Patient Instructions (Signed)
Labs reviewed and stable.  No change in medication

## 2023-04-23 ENCOUNTER — Other Ambulatory Visit: Payer: Self-pay

## 2023-04-23 ENCOUNTER — Ambulatory Visit

## 2023-04-23 DIAGNOSIS — E669 Obesity, unspecified: Secondary | ICD-10-CM | POA: Insufficient documentation

## 2023-04-23 DIAGNOSIS — E039 Hypothyroidism, unspecified: Secondary | ICD-10-CM | POA: Insufficient documentation

## 2023-04-23 DIAGNOSIS — E785 Hyperlipidemia, unspecified: Secondary | ICD-10-CM | POA: Insufficient documentation

## 2023-04-23 DIAGNOSIS — I2510 Atherosclerotic heart disease of native coronary artery without angina pectoris: Secondary | ICD-10-CM | POA: Insufficient documentation

## 2023-04-23 DIAGNOSIS — I361 Nonrheumatic tricuspid (valve) insufficiency: Secondary | ICD-10-CM | POA: Insufficient documentation

## 2023-04-23 DIAGNOSIS — I471 Supraventricular tachycardia, unspecified: Secondary | ICD-10-CM | POA: Insufficient documentation

## 2023-04-23 DIAGNOSIS — R0609 Other forms of dyspnea: Secondary | ICD-10-CM | POA: Insufficient documentation

## 2023-04-23 DIAGNOSIS — I340 Nonrheumatic mitral (valve) insufficiency: Secondary | ICD-10-CM | POA: Insufficient documentation

## 2023-04-23 DIAGNOSIS — I350 Nonrheumatic aortic (valve) stenosis: Secondary | ICD-10-CM | POA: Insufficient documentation

## 2023-04-23 DIAGNOSIS — G4733 Obstructive sleep apnea (adult) (pediatric): Secondary | ICD-10-CM | POA: Insufficient documentation

## 2023-04-23 LAB — LIPID PANEL
CHOL/HDL RATIO: 5.1 — ABNORMAL HIGH (ref <=5.0)
CHOLESTEROL: 272 mg/dL — ABNORMAL HIGH (ref 0–199)
HDL CHOL: 53 mg/dL (ref 40–60)
LDL CALC: 185 mg/dL — ABNORMAL HIGH (ref 0–99)
LDL/HDL RATIO: 3.5
TRIGLYCERIDES: 172 mg/dL — ABNORMAL HIGH (ref 0–150)

## 2023-04-23 LAB — THYROID STIMULATING HORMONE (SENSITIVE TSH): TSH: 0.272 u[IU]/mL — ABNORMAL LOW (ref 0.350–4.940)

## 2023-04-23 LAB — HEPATIC FUNCTION PANEL
ALBUMIN: 3.7 g/dL (ref 3.5–4.8)
ALKALINE PHOSPHATASE: 93 U/L (ref 45–117)
ALT (SGPT): 30 U/L (ref 10–49)
AST (SGOT): 25 U/L (ref 12–45)
BILIRUBIN DIRECT: 0.1 mg/dL (ref <=0.2)
BILIRUBIN TOTAL: 0.5 mg/dL (ref <=1.0)
BILIRUBIN, INDIRECT: 0.4 mg/dL
GLOBULIN: 3 g/dL (ref 2.3–3.5)
PROTEIN TOTAL: 6.7 g/dL (ref 6.4–8.2)

## 2023-04-23 LAB — THYROXINE, FREE (FREE T4): THYROXINE (T4), FREE: 1.41 ng/dL (ref 0.76–1.46)

## 2023-04-24 ENCOUNTER — Telehealth (INDEPENDENT_AMBULATORY_CARE_PROVIDER_SITE_OTHER): Payer: Self-pay | Admitting: PHYSICIAN ASSISTANT

## 2023-04-24 NOTE — Patient Instructions (Signed)
 Remain on current dose for now.  Will discuss in May

## 2023-04-24 NOTE — Telephone Encounter (Addendum)
 Patient called states that [facility] drew her TSH labs early with her other labs on accident. She is asking if there are any changes that need to be done with her thyroid prior to her May appt???      Patient informed

## 2023-05-06 ENCOUNTER — Ambulatory Visit (INDEPENDENT_AMBULATORY_CARE_PROVIDER_SITE_OTHER): Payer: Self-pay | Admitting: PHYSICIAN ASSISTANT

## 2023-05-16 ENCOUNTER — Ambulatory Visit (INDEPENDENT_AMBULATORY_CARE_PROVIDER_SITE_OTHER): Payer: Self-pay | Admitting: PHYSICIAN ASSISTANT

## 2023-05-16 ENCOUNTER — Encounter (INDEPENDENT_AMBULATORY_CARE_PROVIDER_SITE_OTHER): Payer: Self-pay | Admitting: PHYSICIAN ASSISTANT

## 2023-09-10 ENCOUNTER — Telehealth (INDEPENDENT_AMBULATORY_CARE_PROVIDER_SITE_OTHER): Payer: Self-pay | Admitting: PHYSICIAN ASSISTANT

## 2023-09-10 DIAGNOSIS — E039 Hypothyroidism, unspecified: Secondary | ICD-10-CM

## 2023-09-11 ENCOUNTER — Ambulatory Visit (INDEPENDENT_AMBULATORY_CARE_PROVIDER_SITE_OTHER): Admitting: PHYSICIAN ASSISTANT

## 2023-09-19 ENCOUNTER — Other Ambulatory Visit (INDEPENDENT_AMBULATORY_CARE_PROVIDER_SITE_OTHER): Payer: Self-pay | Admitting: PHYSICIAN ASSISTANT

## 2023-09-29 ENCOUNTER — Other Ambulatory Visit: Payer: Self-pay

## 2023-09-29 ENCOUNTER — Ambulatory Visit

## 2023-09-29 DIAGNOSIS — R0602 Shortness of breath: Secondary | ICD-10-CM | POA: Insufficient documentation

## 2023-09-29 DIAGNOSIS — I350 Nonrheumatic aortic (valve) stenosis: Secondary | ICD-10-CM | POA: Insufficient documentation

## 2023-09-29 LAB — BASIC METABOLIC PANEL
ANION GAP: 8 mmol/L (ref 3–16)
BUN/CREA RATIO: 11 (ref 8–30)
BUN: 11 mg/dL (ref <=22)
CALCIUM: 9.3 mg/dL (ref 8.5–10.5)
CHLORIDE: 101 mmol/L (ref 96–107)
CO2 TOTAL: 27 mmol/L (ref 23–35)
CREATININE: 0.96 mg/dL (ref 0.55–1.02)
ESTIMATED GFR: >60 mL/min/1.73mˆ2 (ref >59)
GLUCOSE: 97 mg/dL (ref 70–100)
POTASSIUM: 3.8 mmol/L (ref 3.5–5.0)
SODIUM: 136 mmol/L (ref 135–145)

## 2023-09-29 LAB — B-TYPE NATRIURETIC PEPTIDE (BNP),PLASMA: BNP: 39 pg/mL (ref <=100)

## 2023-10-23 ENCOUNTER — Other Ambulatory Visit: Payer: Self-pay

## 2023-10-23 ENCOUNTER — Ambulatory Visit

## 2023-10-23 DIAGNOSIS — E039 Hypothyroidism, unspecified: Secondary | ICD-10-CM | POA: Insufficient documentation

## 2023-10-23 LAB — THYROXINE, FREE (FREE T4): THYROXINE (T4), FREE: 1.53 ng/dL — ABNORMAL HIGH (ref 0.76–1.46)

## 2023-10-23 LAB — THYROID STIMULATING HORMONE (SENSITIVE TSH): TSH: 0.592 u[IU]/mL (ref 0.350–4.940)

## 2023-10-29 ENCOUNTER — Encounter (INDEPENDENT_AMBULATORY_CARE_PROVIDER_SITE_OTHER): Payer: Self-pay | Admitting: PHYSICIAN ASSISTANT

## 2023-10-29 ENCOUNTER — Other Ambulatory Visit: Payer: Self-pay

## 2023-10-29 ENCOUNTER — Ambulatory Visit: Attending: PHYSICIAN ASSISTANT | Admitting: PHYSICIAN ASSISTANT

## 2023-10-29 VITALS — Ht 66.0 in | Wt 228.0 lb

## 2023-10-29 DIAGNOSIS — Z79899 Other long term (current) drug therapy: Secondary | ICD-10-CM

## 2023-10-29 DIAGNOSIS — E039 Hypothyroidism, unspecified: Secondary | ICD-10-CM | POA: Insufficient documentation

## 2023-10-29 NOTE — Progress Notes (Signed)
 ENDOCRINOLOGY, [facility]  [address]  [place] [address]  Operated by [facility]  Progress Note    Name: Sheri Kemp MRN:  [id number]   Date: 10/29/2023 Age: 73 y.o.       Subjective:     Patient ID:   Sheri Kemp is an 73 y.o. female.      Chief Complaint:      Chief Complaint   Patient presents with    Hypothyroidism     F/u     Wears CPAP mild    Recently had symptoms of shortness of breath and has been worse.  She has had episodes of heat intolerance  She had episode of heart rate increased to 150.  Now had a stress test  Has history of aortic stenosis.  Now have right heart catherization October 27th     Latest Reference Range & Units 10/23/23 14:52   TSH 0.350 - 4.940 uIU/mL 0.592   THYROXINE, FREE (FREE T4) 0.76 - 1.46 ng/dL 8.46 (H)   (H): Data is abnormally high        Review of Systems   Constitutional:  Negative for malaise/fatigue and weight loss.   Cardiovascular:  Negative for chest pain and palpitations.   Endo/Heme/Allergies:         Heat intolerance is better       Objective:   Physical Exam  Neck:      Thyroid : Thyroid  normal.      Vascular: No carotid bruit.   Cardiovascular:      Rate and Rhythm: Regular rhythm.   Musculoskeletal:      Cervical back: Neck supple.   Lymphadenopathy:   No no anterior cervical adenopathy or no posterior cervical adenopathy.    Upper Body: No supraclavicular adenopathy is present.     Pulm:    Lungs are clear to auscultation.    Thyroid :  Thyroid  normal.              Assessment & Plan:       ICD-10-CM    1. Hypothyroidism, adult  E03.9         Plan:  No change in Synthroid  125 mcg daily DAW  Reassured that thyroid  is not cause of current symptoms  TSH, Free T4 middle January and results by phone  TSH, Free T4 middle April      Diyana Starrett, PA-C

## 2023-11-28 ENCOUNTER — Other Ambulatory Visit: Payer: Self-pay

## 2023-11-28 ENCOUNTER — Ambulatory Visit

## 2023-11-28 DIAGNOSIS — I2510 Atherosclerotic heart disease of native coronary artery without angina pectoris: Secondary | ICD-10-CM | POA: Insufficient documentation

## 2023-11-28 DIAGNOSIS — E785 Hyperlipidemia, unspecified: Secondary | ICD-10-CM | POA: Insufficient documentation

## 2023-11-28 DIAGNOSIS — I350 Nonrheumatic aortic (valve) stenosis: Secondary | ICD-10-CM | POA: Insufficient documentation

## 2023-11-28 DIAGNOSIS — I10 Essential (primary) hypertension: Secondary | ICD-10-CM | POA: Insufficient documentation

## 2023-11-28 LAB — HEPATIC FUNCTION PANEL
ALBUMIN: 3.9 g/dL (ref 3.5–4.8)
ALKALINE PHOSPHATASE: 97 U/L (ref 45–117)
ALT (SGPT): 21 U/L (ref 10–49)
AST (SGOT): 21 U/L (ref 12–45)
BILIRUBIN DIRECT: 0.2 mg/dL (ref <=0.2)
BILIRUBIN TOTAL: 0.7 mg/dL (ref <=1.0)
BILIRUBIN, INDIRECT: 0.5 mg/dL
GLOBULIN: 3.2 g/dL (ref 2.3–3.5)
PROTEIN TOTAL: 7.1 g/dL (ref 6.4–8.2)

## 2023-11-28 LAB — BASIC METABOLIC PANEL
ANION GAP: 9 mmol/L (ref 3–16)
BUN/CREA RATIO: 12 (ref 8–30)
BUN: 13 mg/dL (ref <=22)
CALCIUM: 9.6 mg/dL (ref 8.5–10.5)
CHLORIDE: 103 mmol/L (ref 96–107)
CO2 TOTAL: 28 mmol/L (ref 23–35)
CREATININE: 1.06 mg/dL — ABNORMAL HIGH (ref 0.55–1.02)
ESTIMATED GFR: 56 mL/min/1.73mˆ2 — ABNORMAL LOW (ref >59)
GLUCOSE: 99 mg/dL (ref 70–100)
POTASSIUM: 3.8 mmol/L (ref 3.5–5.0)
SODIUM: 140 mmol/L (ref 135–145)

## 2023-12-08 ENCOUNTER — Other Ambulatory Visit: Payer: Self-pay

## 2023-12-08 ENCOUNTER — Ambulatory Visit

## 2023-12-08 DIAGNOSIS — R069 Unspecified abnormalities of breathing: Secondary | ICD-10-CM | POA: Insufficient documentation

## 2023-12-08 LAB — BASIC METABOLIC PANEL
ANION GAP: 11 mmol/L (ref 3–16)
BUN/CREA RATIO: 15 (ref 8–30)
BUN: 18 mg/dL (ref <=22)
CALCIUM: 9.6 mg/dL (ref 8.5–10.5)
CHLORIDE: 97 mmol/L (ref 96–107)
CO2 TOTAL: 31 mmol/L (ref 23–35)
CREATININE: 1.18 mg/dL — ABNORMAL HIGH (ref 0.55–1.02)
ESTIMATED GFR: 49 mL/min/1.73mˆ2 — ABNORMAL LOW (ref >59)
GLUCOSE: 113 mg/dL — ABNORMAL HIGH (ref 70–100)
POTASSIUM: 3.6 mmol/L (ref 3.5–5.0)
SODIUM: 139 mmol/L (ref 135–145)

## 2023-12-16 ENCOUNTER — Ambulatory Visit

## 2023-12-16 ENCOUNTER — Other Ambulatory Visit: Payer: Self-pay

## 2023-12-16 DIAGNOSIS — R0602 Shortness of breath: Secondary | ICD-10-CM

## 2023-12-16 DIAGNOSIS — I350 Nonrheumatic aortic (valve) stenosis: Secondary | ICD-10-CM

## 2023-12-16 LAB — BASIC METABOLIC PANEL
ANION GAP: 11 mmol/L (ref 3–16)
BUN/CREA RATIO: 14 (ref 8–30)
BUN: 15 mg/dL (ref <=22)
CALCIUM: 9.5 mg/dL (ref 8.5–10.5)
CHLORIDE: 98 mmol/L (ref 96–107)
CO2 TOTAL: 31 mmol/L (ref 23–35)
CREATININE: 1.07 mg/dL — ABNORMAL HIGH (ref 0.55–1.02)
ESTIMATED GFR: 55 mL/min/1.73mˆ2 — ABNORMAL LOW (ref >59)
GLUCOSE: 108 mg/dL — ABNORMAL HIGH (ref 70–100)
POTASSIUM: 3.1 mmol/L — ABNORMAL LOW (ref 3.5–5.0)
SODIUM: 140 mmol/L (ref 135–145)

## 2023-12-23 ENCOUNTER — Other Ambulatory Visit: Payer: Self-pay

## 2023-12-23 ENCOUNTER — Ambulatory Visit

## 2023-12-23 DIAGNOSIS — R0602 Shortness of breath: Secondary | ICD-10-CM

## 2023-12-23 LAB — BASIC METABOLIC PANEL
ANION GAP: 9 mmol/L (ref 3–16)
BUN/CREA RATIO: 15 (ref 8–30)
BUN: 18 mg/dL (ref <=22)
CALCIUM: 9.6 mg/dL (ref 8.5–10.5)
CHLORIDE: 100 mmol/L (ref 96–107)
CO2 TOTAL: 32 mmol/L (ref 23–35)
CREATININE: 1.19 mg/dL — ABNORMAL HIGH (ref 0.55–1.02)
ESTIMATED GFR: 49 mL/min/1.73mˆ2 — ABNORMAL LOW (ref >59)
GLUCOSE: 135 mg/dL — ABNORMAL HIGH (ref 70–100)
POTASSIUM: 3.8 mmol/L (ref 3.5–5.0)
SODIUM: 141 mmol/L (ref 135–145)

## 2024-02-04 ENCOUNTER — Other Ambulatory Visit: Payer: Self-pay

## 2024-02-04 ENCOUNTER — Ambulatory Visit: Attending: PHYSICIAN ASSISTANT

## 2024-02-04 DIAGNOSIS — E039 Hypothyroidism, unspecified: Secondary | ICD-10-CM | POA: Insufficient documentation

## 2024-02-04 LAB — THYROID STIMULATING HORMONE (SENSITIVE TSH): TSH: <0.01 u[IU]/mL — ABNORMAL LOW (ref 0.350–4.940)

## 2024-02-04 LAB — THYROXINE, FREE (FREE T4): THYROXINE (T4), FREE: 2.05 ng/dL — ABNORMAL HIGH (ref 0.76–1.46)

## 2024-02-05 ENCOUNTER — Ambulatory Visit (INDEPENDENT_AMBULATORY_CARE_PROVIDER_SITE_OTHER): Payer: Self-pay | Admitting: PHYSICIAN ASSISTANT

## 2024-02-05 MED ORDER — SYNTHROID 112 MCG TABLET: 112.0000 ug | ORAL_TABLET | Freq: Every morning | ORAL | 3 refills | Status: AC

## 2024-02-05 NOTE — Telephone Encounter (Signed)
-----   Message from Greig Fess, [place] sent at 02/05/2024  9:37 AM EST -----  Must reduce for to synthroid  112 mcg daily DAW. Repeat labs before next visit   ----- Message -----  From: Lab, Background User  Sent: 02/04/2024   3:46 PM EST  To: Greig Fess, PA-C

## 2024-02-05 NOTE — Result Encounter Note (Signed)
 Must reduce for to synthroid  112 mcg daily DAW. Repeat labs before next visit

## 2024-05-03 ENCOUNTER — Ambulatory Visit (INDEPENDENT_AMBULATORY_CARE_PROVIDER_SITE_OTHER): Payer: Self-pay | Admitting: PHYSICIAN ASSISTANT
